# Patient Record
Sex: Female | Born: 1960 | Hispanic: No | Marital: Single | Smoking: Former smoker
Health system: Southern US, Community
[De-identification: ages and names within clinical notes are randomized; demographics above are authoritative.]

## PROBLEM LIST (undated history)

## (undated) ENCOUNTER — Emergency Department (HOSPITAL_COMMUNITY): Disposition: A | Discharge status: Home / Self Care | Payer: Self-pay

## (undated) DIAGNOSIS — K219 Gastro-esophageal reflux disease without esophagitis: Secondary | ICD-10-CM

## (undated) DIAGNOSIS — F419 Anxiety disorder, unspecified: Secondary | ICD-10-CM

## (undated) DIAGNOSIS — K900 Celiac disease: Secondary | ICD-10-CM

## (undated) DIAGNOSIS — F329 Major depressive disorder, single episode, unspecified: Secondary | ICD-10-CM

## (undated) DIAGNOSIS — N289 Disorder of kidney and ureter, unspecified: Secondary | ICD-10-CM

## (undated) DIAGNOSIS — E119 Type 2 diabetes mellitus without complications: Secondary | ICD-10-CM

## (undated) DIAGNOSIS — D649 Anemia, unspecified: Secondary | ICD-10-CM

## (undated) DIAGNOSIS — M1990 Unspecified osteoarthritis, unspecified site: Secondary | ICD-10-CM

## (undated) DIAGNOSIS — D72819 Decreased white blood cell count, unspecified: Secondary | ICD-10-CM

## (undated) DIAGNOSIS — F32A Depression, unspecified: Secondary | ICD-10-CM

## (undated) DIAGNOSIS — K861 Other chronic pancreatitis: Secondary | ICD-10-CM

## (undated) DIAGNOSIS — G4730 Sleep apnea, unspecified: Secondary | ICD-10-CM

## (undated) DIAGNOSIS — J40 Bronchitis, not specified as acute or chronic: Secondary | ICD-10-CM

## (undated) DIAGNOSIS — I10 Essential (primary) hypertension: Secondary | ICD-10-CM

## (undated) DIAGNOSIS — F514 Sleep terrors [night terrors]: Secondary | ICD-10-CM

## (undated) DIAGNOSIS — F4310 Post-traumatic stress disorder, unspecified: Secondary | ICD-10-CM

## (undated) HISTORY — PX: RHINOPLASTY: SUR1284

## (undated) HISTORY — PX: FOOT SURGERY: SHX648

## (undated) HISTORY — PX: BREAST SURGERY: SHX581

## (undated) NOTE — *Deleted (*Deleted)
HEMATOLOGY/ONCOLOGY CONSULTATION NOTE  Date of Service: 03/28/2020  Patient Care Team: [name], PA-C as PCP - General (Physician Assistant)  CHIEF COMPLAINTS/PURPOSE OF CONSULTATION:  Leukopenia  HISTORY OF PRESENTING ILLNESS:  Savannah Clark is a wonderful 85 y.o. female who has been referred to [place] by Dr. Courtney Paris for evaluation and management of leukopenia. Pt is accompanied today by ***. The pt reports that she is doing well overall.   The pt reports ***   Of note prior to the patient's visit today, pt has had *** completed on *** with results revealing ***.   Most recent lab results (12/04/2019) of CBC is as follows: all values are WNL except for WBC at 2.5K, MPV at 10.3, Neutro Rel at 37.4, Creatinine at 1.2. 12/04/2019 Ferritin at 59.90 12/04/2019 Iron at 44, UIBC at 310.00, TIBC at 354.00  On review of systems, pt reports *** and denies *** and any other symptoms.   On PMHx the pt reports ***. On Social Hx the pt reports *** On Family Hx the pt reports ***  A: -Discussed patient's most recent labs from ***, *** -***  MEDICAL HISTORY:  Past Medical History:  Diagnosis Date  . Anemia   . Anxiety   . Asthma   . Bronchitis   . Celiac disease   . Depression   . Night terror disorder   . Pancreatitis, chronic (HCC)   . PTSD (post-traumatic stress disorder)   . Renal disorder    mild kidney disease    SURGICAL HISTORY: Past Surgical History:  Procedure Laterality Date  . FOOT SURGERY      SOCIAL HISTORY: Social History   Socioeconomic History  . Marital status: Single    Spouse name: Not on file  . Number of children: Not on file  . Years of education: Not on file  . Highest education level: Not on file  Occupational History  . Not on file  Tobacco Use  . Smoking status: Former Smoker    Quit date: 06/09/2007    Years since quitting: 12.8  . Smokeless tobacco: Never Used  Substance and Sexual Activity  . Alcohol use: Yes    Comment:  rare  . Drug use: No  . Sexual activity: Not on file  Other Topics Concern  . Not on file  Social History Narrative  . Not on file   Social Determinants of Health   Financial Resource Strain:   . Difficulty of Paying Living Expenses: Not on file  Food Insecurity:   . Worried About Programme researcher, broadcasting/film/video in the Last Year: Not on file  . Ran Out of Food in the Last Year: Not on file  Transportation Needs:   . Lack of Transportation (Medical): Not on file  . Lack of Transportation (Non-Medical): Not on file  Physical Activity:   . Days of Exercise per Week: Not on file  . Minutes of Exercise per Session: Not on file  Stress:   . Feeling of Stress : Not on file  Social Connections:   . Frequency of Communication with Friends and Family: Not on file  . Frequency of Social Gatherings with Friends and Family: Not on file  . Attends Religious Services: Not on file  . Active Member of Clubs or Organizations: Not on file  . Attends Banker Meetings: Not on file  . Marital Status: Not on file  Intimate Partner Violence:   . Fear of Current or Ex-Partner: Not on file  .  Emotionally Abused: Not on file  . Physically Abused: Not on file  . Sexually Abused: Not on file    FAMILY HISTORY: No family history on file.  ALLERGIES:  is allergic to adhesive [tape], amoxicillin-pot clavulanate, aspirin, farxiga [dapagliflozin], gluten meal, januvia [sitagliptin], lactose intolerance (gi), and wheat.  MEDICATIONS:  Current Outpatient Medications  Medication Sig Dispense Refill  . albuterol (PROVENTIL HFA;VENTOLIN HFA) 108 (90 BASE) MCG/ACT inhaler Inhale 2 puffs into the lungs every 6 (six) hours as needed. wheezing     . b complex vitamins tablet Take 1 tablet by mouth daily.   (Patient not taking: Reported on 01/09/2020)    . Biotin 5000 MCG CAPS Take by mouth.    Marland Kitchen buPROPion HCl (WELLBUTRIN PO) Take by mouth.    . busPIRone (BUSPAR) 5 MG tablet Take 15 mg by mouth 2 (two) times  daily.  (Patient not taking: Reported on 01/09/2020)    . CALCIUM PO Take 1 tablet by mouth daily.    . cetirizine (ZYRTEC) 5 MG chewable tablet Chew 2 tablets (10 mg total) by mouth daily. 30 tablet 0  . clindamycin (CLEOCIN) 300 MG capsule Take 1 capsule (300 mg total) by mouth 4 (four) times daily. X 7 days (Patient not taking: Reported on 01/09/2020) 28 capsule 0  . diphenhydrAMINE (BENADRYL) 25 MG tablet Take 25 mg by mouth every 6 (six) hours as needed. (Patient not taking: Reported on 01/09/2020)    . fluticasone (FLONASE) 50 MCG/ACT nasal spray Place 1 spray into both nostrils daily for 5 doses. 9.9 g 0  . Fluticasone-Salmeterol (ADVAIR) 250-50 MCG/DOSE AEPB Inhale 1 puff into the lungs 2 (two) times daily.    . hydrOXYzine (VISTARIL) 50 MG capsule Take 50 mg by mouth 2 (two) times daily.      . magnesium 30 MG tablet Take 30 mg by mouth 2 (two) times daily.    . naproxen (NAPROSYN) 500 MG tablet Take 1 tablet (500 mg total) by mouth 2 (two) times daily. (Patient not taking: Reported on 01/09/2020) 30 tablet 0  . ondansetron (ZOFRAN ODT) 8 MG disintegrating tablet 8mg  ODT q8  hours prn nausea 6 tablet 0  . pantoprazole (PROTONIX) 40 MG tablet Take 40 mg by mouth daily. (Patient not taking: Reported on 01/09/2020)    . prazosin (MINIPRESS) 1 MG capsule Take 1 mg by mouth at bedtime. (Patient not taking: Reported on 01/09/2020)    . Prenatal Vit-Fe Fumarate-FA (PRENATAL MULTIVITAMIN) TABS tablet Take 1 tablet by mouth daily at 12 noon.    . prenatal vitamin w/FE, FA (PRENATAL 1 + 1) 27-1 MG TABS tablet Take 1 tablet by mouth daily at 12 noon. (Patient not taking: Reported on 01/09/2020)    . topiramate (TOPAMAX) 50 MG tablet Take 50 mg by mouth 2 (two) times daily.   (Patient not taking: Reported on 01/09/2020)    . Vortioxetine HBr (BRINTELLIX) 20 MG TABS Take by mouth.     No current facility-administered medications for this visit.    REVIEW OF SYSTEMS:    10 Point review of Systems was done is  negative except as noted above.  PHYSICAL EXAMINATION: ECOG PERFORMANCE STATUS: {CHL ONC ECOG RU:[id number]}  .There were no vitals filed for this visit. There were no vitals filed for this visit. .There is no height or weight on file to calculate BMI.  *** GENERAL:alert, in no acute distress and comfortable SKIN: no acute rashes, no significant lesions EYES: conjunctiva are pink and non-injected, sclera anicteric OROPHARYNX:  MMM, no exudates, no oropharyngeal erythema or ulceration NECK: supple, no JVD LYMPH:  no palpable lymphadenopathy in the cervical, axillary or inguinal regions LUNGS: clear to auscultation b/l with normal respiratory effort HEART: regular rate & rhythm ABDOMEN:  normoactive bowel sounds , non tender, not distended. Extremity: no pedal edema PSYCH: alert & oriented x 3 with fluent speech NEURO: no focal motor/sensory deficits  LABORATORY DATA:  I have reviewed the data as listed  . CBC Latest Ref Rng & Units 04/22/2011 04/22/2011 04/10/2011  WBC 4.0 - 10.5 K/uL - 5.3 -  Hemoglobin 12.0 - 15.0 g/dL 16.1 09.6 04.5  Hematocrit 36 - 46 % 38.0 35.6(L) 44.0  Platelets 150 - 400 K/uL - 212 -    . CMP Latest Ref Rng & Units 04/22/2011 04/10/2011  Glucose 70 - 99 mg/dL 86 409(W)  BUN 6 - 23 mg/dL 16 14  Creatinine 1.19 - 1.10 mg/dL 1.47 8.29  Sodium 562 - 145 mEq/L 141 139  Potassium 3.5 - 5.1 mEq/L 3.8 4.2  Chloride 96 - 112 mEq/L 107 104     RADIOGRAPHIC STUDIES: I have personally reviewed the radiological images as listed and agreed with the findings in the report. No results found.  ASSESSMENT & PLAN:   PLAN: ***  FOLLOW UP: ***  All of the patients questions were answered with apparent satisfaction. The patient knows to call the clinic with any problems, questions or concerns.  I spent *** counseling the patient face to face. The total time spent in the appointment was *** and more than 50% was on counseling and direct patient cares.     Wyvonnia Lora MD MS AAHIVMS [facility] Hematology/Oncology Physician [facility]  (Office):       [phone] (Work cell):  [phone] (Fax):           [phone]  03/28/2020 8:10 AM  I, Carollee Herter, am acting as a scribe for Dr. Wyvonnia Lora.   {Add Production assistant, radio Statement}

---
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
## 2011-02-27 ENCOUNTER — Inpatient Hospital Stay (INDEPENDENT_AMBULATORY_CARE_PROVIDER_SITE_OTHER)
Admission: RE | Admit: 2011-02-27 | Discharge: 2011-02-27 | Disposition: A | Discharge status: Home / Self Care | Payer: Self-pay | Source: Ambulatory Visit | Attending: Family Medicine | Admitting: Family Medicine

## 2011-02-27 DIAGNOSIS — W57XXXA Bitten or stung by nonvenomous insect and other nonvenomous arthropods, initial encounter: Secondary | ICD-10-CM

## 2011-02-27 DIAGNOSIS — Z760 Encounter for issue of repeat prescription: Secondary | ICD-10-CM

## 2011-04-10 ENCOUNTER — Inpatient Hospital Stay (INDEPENDENT_AMBULATORY_CARE_PROVIDER_SITE_OTHER)
Admission: RE | Admit: 2011-04-10 | Discharge: 2011-04-10 | Disposition: A | Discharge status: Home / Self Care | Payer: Self-pay | Source: Ambulatory Visit | Attending: Family Medicine | Admitting: Family Medicine

## 2011-04-10 DIAGNOSIS — R5383 Other fatigue: Secondary | ICD-10-CM

## 2011-04-10 DIAGNOSIS — F329 Major depressive disorder, single episode, unspecified: Secondary | ICD-10-CM

## 2011-04-10 DIAGNOSIS — J309 Allergic rhinitis, unspecified: Secondary | ICD-10-CM

## 2011-04-10 LAB — POCT I-STAT, CHEM 8
Calcium, Ion: 1.23 mmol/L (ref 1.12–1.32)
Glucose, Bld: 106 mg/dL — ABNORMAL HIGH (ref 70–99)
HCT: 44 % (ref 36.0–46.0)
Hemoglobin: 15 g/dL (ref 12.0–15.0)
TCO2: 25 mmol/L (ref 0–100)

## 2011-04-14 NOTE — ED Notes (Signed)
Spoke to pharmacist at [facility] about medication change.... Pharmacy called requesting hydroxyzine be changed to hydroxyzine pamoate 50mg   One bid, #60.  Order received from dr Lorenz Coaster.  klh

## 2011-04-22 ENCOUNTER — Emergency Department (HOSPITAL_COMMUNITY): Payer: Self-pay

## 2011-04-22 ENCOUNTER — Emergency Department (HOSPITAL_COMMUNITY)
Admission: EM | Admit: 2011-04-22 | Discharge: 2011-04-22 | Disposition: A | Discharge status: Home / Self Care | Payer: Self-pay | Attending: Emergency Medicine | Admitting: Emergency Medicine

## 2011-04-22 ENCOUNTER — Encounter: Payer: Self-pay | Admitting: *Deleted

## 2011-04-22 DIAGNOSIS — R0602 Shortness of breath: Secondary | ICD-10-CM | POA: Insufficient documentation

## 2011-04-22 DIAGNOSIS — R079 Chest pain, unspecified: Secondary | ICD-10-CM | POA: Insufficient documentation

## 2011-04-22 DIAGNOSIS — B9789 Other viral agents as the cause of diseases classified elsewhere: Secondary | ICD-10-CM | POA: Insufficient documentation

## 2011-04-22 DIAGNOSIS — R059 Cough, unspecified: Secondary | ICD-10-CM | POA: Insufficient documentation

## 2011-04-22 DIAGNOSIS — IMO0001 Reserved for inherently not codable concepts without codable children: Secondary | ICD-10-CM | POA: Insufficient documentation

## 2011-04-22 DIAGNOSIS — R599 Enlarged lymph nodes, unspecified: Secondary | ICD-10-CM | POA: Insufficient documentation

## 2011-04-22 DIAGNOSIS — B349 Viral infection, unspecified: Secondary | ICD-10-CM

## 2011-04-22 DIAGNOSIS — R5381 Other malaise: Secondary | ICD-10-CM | POA: Insufficient documentation

## 2011-04-22 DIAGNOSIS — R05 Cough: Secondary | ICD-10-CM | POA: Insufficient documentation

## 2011-04-22 HISTORY — DX: Celiac disease: K90.0

## 2011-04-22 LAB — CBC
MCHC: 34.3 g/dL (ref 30.0–36.0)
Platelets: 212 10*3/uL (ref 150–400)
RDW: 13.2 % (ref 11.5–15.5)
WBC: 5.3 10*3/uL (ref 4.0–10.5)

## 2011-04-22 LAB — POCT I-STAT, CHEM 8
Glucose, Bld: 86 mg/dL (ref 70–99)
HCT: 38 % (ref 36.0–46.0)
Hemoglobin: 12.9 g/dL (ref 12.0–15.0)
Potassium: 3.8 mEq/L (ref 3.5–5.1)
Sodium: 141 mEq/L (ref 135–145)

## 2011-04-22 LAB — DIFFERENTIAL
Basophils Absolute: 0 10*3/uL (ref 0.0–0.1)
Basophils Relative: 1 % (ref 0–1)
Monocytes Absolute: 0.4 10*3/uL (ref 0.1–1.0)
Neutro Abs: 1.9 10*3/uL (ref 1.7–7.7)
Neutrophils Relative %: 36 % — ABNORMAL LOW (ref 43–77)

## 2011-04-22 LAB — RAPID STREP SCREEN (MED CTR MEBANE ONLY): Streptococcus, Group A Screen (Direct): NEGATIVE

## 2011-04-22 MED ORDER — HYDROCODONE-ACETAMINOPHEN 5-325 MG PO TABS: 1.0000 | ORAL_TABLET | ORAL | Status: AC | PRN

## 2011-04-22 MED ORDER — MORPHINE SULFATE 4 MG/ML IJ SOLN
4.0000 mg | Freq: Once | INTRAMUSCULAR | Status: AC
  Administered 2011-04-22: 4 mg via INTRAVENOUS
  Filled 2011-04-22: qty 1

## 2011-04-22 MED ORDER — SODIUM CHLORIDE 0.9 % IV BOLUS (SEPSIS)
1000.0000 mL | Freq: Once | INTRAVENOUS | Status: AC
  Administered 2011-04-22: 1000 mL via INTRAVENOUS

## 2011-04-22 MED ORDER — ONDANSETRON HCL 4 MG/2ML IJ SOLN
4.0000 mg | Freq: Once | INTRAMUSCULAR | Status: AC
  Administered 2011-04-22: 4 mg via INTRAVENOUS
  Filled 2011-04-22: qty 2

## 2011-04-22 NOTE — ED Provider Notes (Signed)
History     CSN: [id number] Arrival date & time: 04/22/2011 11:25 AM   First MD Initiated Contact with Patient 04/22/11 1541      Chief Complaint  Patient presents with  . Cough  . Influenza    (Consider location/radiation/quality/duration/timing/severity/associated sxs/prior treatment) Patient is a 50 y.o. female presenting with cough and flu symptoms. The history is provided by the patient.  Cough The current episode started more than 1 week ago. The problem occurs constantly. The cough is non-productive. Associated symptoms include chest pain, chills, sore throat, myalgias and shortness of breath. Treatments tried: doxycycline and zofran. The treatment provided no relief.  Influenza Associated symptoms include chest pain and shortness of breath.   patient states she's had the flu for the last week and a half. States she's had nausea vomiting and a little diarrhea. She's had a nonproductive cough. She states that she saw a doctor and was started on doxycycline and Zofran without relief. She states she is so short of breath now she cannot walk the 6 minute walk to the bus stop. She states that she hurts all over, especially with coughing. No sick contacts. She states that she is dehydrated. He is having chest or abdominal pain.  Past Medical History  Diagnosis Date  . Asthma   . Celiac disease     Past Surgical History  Procedure Date  . Foot surgery     No family history on file.  History  Substance Use Topics  . Smoking status: Never Smoker   . Smokeless tobacco: Not on file  . Alcohol Use: Yes     socially    OB History    Grav Para Term Preterm Abortions TAB SAB Ect Mult Living                  Review of Systems  Constitutional: Positive for chills and fatigue. Negative for appetite change.  HENT: Positive for sore throat. Negative for neck pain.   Respiratory: Positive for cough and shortness of breath.   Cardiovascular: Positive for chest pain.    Genitourinary: Negative for dysuria.  Musculoskeletal: Positive for myalgias.  Neurological: Positive for weakness.  Hematological: Negative for adenopathy.  Psychiatric/Behavioral: Negative for behavioral problems.    Allergies  Adhesive; Aspirin; Augmentin; and Wheat  Home Medications   Current Outpatient Rx  Name Route Sig Dispense Refill  . ALBUTEROL SULFATE HFA 108 (90 BASE) MCG/ACT IN AERS Inhalation Inhale 2 puffs into the lungs every 6 (six) hours as needed. wheezing     . B COMPLEX PO TABS Oral Take 1 tablet by mouth daily.      . BUSPIRONE HCL 5 MG PO TABS Oral Take 5 mg by mouth 2 (two) times daily.      Marland Kitchen CITALOPRAM HYDROBROMIDE 40 MG PO TABS Oral Take 40 mg by mouth daily.      Marland Kitchen DOXYCYCLINE HYCLATE 100 MG PO CAPS Oral Take 100 mg by mouth 2 (two) times daily.      Marland Kitchen FLUTICASONE PROPIONATE 50 MCG/ACT NA SUSP Nasal Place 2 sprays into the nose daily as needed. Allergies      . HYDROXYZINE PAMOATE 50 MG PO CAPS Oral Take 50 mg by mouth 2 (two) times daily.      Marland Kitchen PRENATAL PLUS 27-1 MG PO TABS Oral Take 1 tablet by mouth daily.      . TOPIRAMATE 50 MG PO TABS Oral Take 50 mg by mouth 2 (two) times daily.      Marland Kitchen  TRAZODONE HCL 100 MG PO TABS Oral Take 100 mg by mouth at bedtime.      Marland Kitchen HYDROCODONE-ACETAMINOPHEN 5-325 MG PO TABS Oral Take 1 tablet by mouth every 4 (four) hours as needed for pain. 5 tablet 0    BP 141/84  Pulse 57  Temp(Src) 98 F (36.7 C) (Oral)  Resp 20  SpO2 100%  Physical Exam  Nursing note and vitals reviewed. Constitutional: She is oriented to person, place, and time. She appears well-developed and well-nourished.  HENT:  Head: Normocephalic and atraumatic.  Mouth/Throat: No oropharyngeal exudate.       Mild posterior pharyngeal erythema without exudate. Mild anterior cervical lymph nodes.  Eyes: EOM are normal. Pupils are equal, round, and reactive to light.  Neck: Normal range of motion. Neck supple.  Cardiovascular: Normal rate, regular  rhythm and normal heart sounds.   No murmur heard. Pulmonary/Chest: Effort normal and breath sounds normal. No respiratory distress. She has no wheezes. She has no rales.  Abdominal: Soft. Bowel sounds are normal. She exhibits no distension. There is no tenderness. There is no rebound and no guarding.  Musculoskeletal: Normal range of motion.  Neurological: She is alert and oriented to person, place, and time. No cranial nerve deficit.  Skin: Skin is warm and dry.  Psychiatric: She has a normal mood and affect. Her speech is normal.    ED Course  Procedures (including critical care time)  Labs Reviewed  CBC - Abnormal; Notable for the following:    HCT 35.6 (*)    All other components within normal limits  DIFFERENTIAL - Abnormal; Notable for the following:    Neutrophils Relative 36 (*)    Lymphocytes Relative 52 (*)    All other components within normal limits  RAPID STREP SCREEN  POCT I-STAT, CHEM 8  I-STAT, CHEM 8   Dg Chest 2 View  04/22/2011  *RADIOLOGY REPORT*  Clinical Data: S O B  CHEST - 2 VIEW  Comparison: None  Findings: The heart size and mediastinal contours are within normal limits.  Both lungs are clear.  The visualized skeletal structures are unremarkable.  IMPRESSION: No active cardiopulmonary abnormalities.  Original Report Authenticated By: Rosealee Albee, M.D.     1. Viral infection       MDM  Patient has had cough nausea vomiting and some diarrhea for the last week. She states she shortness of breath persistent the bus stop. Her chest x-ray does not show pneumonia. She feels better after IV fluids. She'll be discharged home with hydrocodone for cough and pains.        Juliet Rude. Rubin Payor, MD 04/22/11 Rickey Primus

## 2011-04-22 NOTE — ED Notes (Signed)
Pt reports nonproductive cough, flu-like symptoms, n/v x1week. Shob episode walking to bus stop this am. No distress. ems bp 130/84, p 84, r 12.

## 2011-09-17 ENCOUNTER — Encounter (HOSPITAL_COMMUNITY): Payer: Self-pay | Admitting: Emergency Medicine

## 2011-09-17 ENCOUNTER — Emergency Department (HOSPITAL_COMMUNITY)
Admission: EM | Admit: 2011-09-17 | Discharge: 2011-09-17 | Disposition: A | Discharge status: Home / Self Care | Payer: Self-pay | Attending: Emergency Medicine | Admitting: Emergency Medicine

## 2011-09-17 ENCOUNTER — Emergency Department (HOSPITAL_COMMUNITY): Payer: Self-pay

## 2011-09-17 DIAGNOSIS — J45909 Unspecified asthma, uncomplicated: Secondary | ICD-10-CM | POA: Insufficient documentation

## 2011-09-17 DIAGNOSIS — R059 Cough, unspecified: Secondary | ICD-10-CM | POA: Insufficient documentation

## 2011-09-17 DIAGNOSIS — R0789 Other chest pain: Secondary | ICD-10-CM | POA: Insufficient documentation

## 2011-09-17 DIAGNOSIS — R05 Cough: Secondary | ICD-10-CM | POA: Insufficient documentation

## 2011-09-17 DIAGNOSIS — R0602 Shortness of breath: Secondary | ICD-10-CM | POA: Insufficient documentation

## 2011-09-17 HISTORY — DX: Anemia, unspecified: D64.9

## 2011-09-17 HISTORY — DX: Disorder of kidney and ureter, unspecified: N28.9

## 2011-09-17 MED ORDER — PREDNISONE 10 MG PO TABS: 20.0000 mg | ORAL_TABLET | Freq: Two times a day (BID) | ORAL | Status: DC

## 2011-09-17 MED ORDER — PREDNISONE 20 MG PO TABS
60.0000 mg | ORAL_TABLET | Freq: Once | ORAL | Status: AC
  Administered 2011-09-17: 60 mg via ORAL
  Filled 2011-09-17: qty 3

## 2011-09-17 MED ORDER — IPRATROPIUM BROMIDE 0.02 % IN SOLN
0.5000 mg | Freq: Once | RESPIRATORY_TRACT | Status: AC
  Administered 2011-09-17: 0.5 mg via RESPIRATORY_TRACT
  Filled 2011-09-17: qty 2.5

## 2011-09-17 MED ORDER — CETIRIZINE HCL 5 MG PO CHEW: 10.0000 mg | CHEWABLE_TABLET | Freq: Every day | ORAL | Status: DC

## 2011-09-17 MED ORDER — ALBUTEROL SULFATE (5 MG/ML) 0.5% IN NEBU
5.0000 mg | INHALATION_SOLUTION | Freq: Once | RESPIRATORY_TRACT | Status: AC
  Administered 2011-09-17: 5 mg via RESPIRATORY_TRACT
  Filled 2011-09-17: qty 1

## 2011-09-17 NOTE — ED Notes (Signed)
Patient transported to X-ray 

## 2011-09-17 NOTE — Discharge Instructions (Signed)
Take prednisone and cetirizine as prescribed.  Try taking sudafed twice a day as needed for nasal congestion.  Continue your ventolin (rescue) and symbicort (maintenance) as prescribed.  You should return to the ER if your symptoms worsen, particularly if you develop increasing difficulty breathing.

## 2011-09-17 NOTE — ED Notes (Signed)
Pt reports over the last 3 weeks she has had difficulty breathing, runny nose and dry cough due to her seasonal allergies. Pt reports using home controller medications including her albuterol inhaler with little relief. Lungs CTA in triage, no resp distress noted.

## 2011-09-17 NOTE — ED Provider Notes (Signed)
History     CSN: [id number]  Arrival date & time 09/17/11  1205   First MD Initiated Contact with Patient 09/17/11 1241      Chief Complaint  Patient presents with  . Allergies    (Consider location/radiation/quality/duration/timing/severity/associated sxs/prior treatment) HPI History provided by pt.   Pt has h/o seasonal allergies and allergic asthma.  For the past three weeks, she has been experiencing gradually worsening non-productive cough, diffuse chest tightness, SOB w/ minimal exertion and non-productive cough.  Has been taking symbicort and albuterol but just realized today that she has been taking them incorrectly for years.  Uses symbicort for rescue and albuterol for maintenance.  Has not had relief w/ these medications.  Has also had subjective fever, nasal congestion and rhinorrhea.  Has tried claritin which makes her intolerably drowsy and has not had any improvement of symptoms w/ allegra.    Past Medical History  Diagnosis Date  . Asthma   . Celiac disease   . Renal disorder   . Anemia     Past Surgical History  Procedure Date  . Foot surgery     History reviewed. No pertinent family history.  History  Substance Use Topics  . Smoking status: Former Smoker    Quit date: 06/09/2007  . Smokeless tobacco: Not on file  . Alcohol Use: Yes     socially    OB History    Grav Para Term Preterm Abortions TAB SAB Ect Mult Living                  Review of Systems  All other systems reviewed and are negative.    Allergies  Adhesive; Aspirin; Augmentin; and Wheat  Home Medications   Current Outpatient Rx  Name Route Sig Dispense Refill  . ALBUTEROL SULFATE HFA 108 (90 BASE) MCG/ACT IN AERS Inhalation Inhale 2 puffs into the lungs every 6 (six) hours as needed. wheezing     . B COMPLEX PO TABS Oral Take 1 tablet by mouth daily.      . BUDESONIDE-FORMOTEROL FUMARATE 160-4.5 MCG/ACT IN AERO Inhalation Inhale 2 puffs into the lungs 2 (two) times daily.      . BUSPIRONE HCL 5 MG PO TABS Oral Take 5 mg by mouth 2 (two) times daily.      Marland Kitchen CALCIUM PO Oral Take 1 tablet by mouth daily.    Marland Kitchen CITALOPRAM HYDROBROMIDE 40 MG PO TABS Oral Take 40 mg by mouth daily.      Marland Kitchen HYDROXYZINE PAMOATE 50 MG PO CAPS Oral Take 50 mg by mouth 2 (two) times daily.      . MOMETASONE FUROATE 50 MCG/ACT NA SUSP Nasal Place 2 sprays into the nose daily.    Marland Kitchen MONTELUKAST SODIUM 10 MG PO TABS Oral Take 10 mg by mouth at bedtime.    Marland Kitchen TEMAZEPAM 15 MG PO CAPS Oral Take 15 mg by mouth at bedtime as needed. For sleep    . TOPIRAMATE 50 MG PO TABS Oral Take 50 mg by mouth 2 (two) times daily.        BP 124/83  Pulse 75  Temp(Src) 98.4 F (36.9 C) (Oral)  Resp 18  SpO2 99%  Physical Exam  Nursing note and vitals reviewed. Constitutional: She is oriented to person, place, and time. She appears well-developed and well-nourished. No distress.  HENT:  Head: Normocephalic and atraumatic. No trismus in the jaw.  Right Ear: Tympanic membrane, external ear and ear canal normal.  Left Ear:  Tympanic membrane, external ear and ear canal normal.  Mouth/Throat: Uvula is midline and mucous membranes are normal. No oropharyngeal exudate, posterior oropharyngeal edema or posterior oropharyngeal erythema.  Eyes:       Normal appearance  Neck: Normal range of motion. Neck supple.  Cardiovascular: Normal rate and regular rhythm.   Pulmonary/Chest: Effort normal and breath sounds normal. She has no wheezes. She exhibits no tenderness.  Musculoskeletal: Normal range of motion.  Lymphadenopathy:    She has no cervical adenopathy.  Neurological: She is alert and oriented to person, place, and time.  Skin: Skin is warm and dry. No rash noted.  Psychiatric: She has a normal mood and affect. Her behavior is normal.    ED Course  Procedures (including critical care time)   Date: 09/17/2011  Rate: 67  Rhythm: normal sinus rhythm and sinus arrhythmia  QRS Axis: normal  Intervals:  normal  ST/T Wave abnormalities: normal  Conduction Disutrbances:none  Narrative Interpretation:   Old EKG Reviewed: none available   Labs Reviewed - No data to display Dg Chest 2 View  09/17/2011  *RADIOLOGY REPORT*  Clinical Data: Shortness of breath, cough.  CHEST - 2 VIEW  Comparison: 04/22/2011  Findings: Heart and mediastinal contours are within normal limits. No focal opacities or effusions.  No acute bony abnormality.  IMPRESSION: No active cardiopulmonary disease.  Original Report Authenticated By: Cyndie Chime, M.D.     1. Allergic rhinitis   2. Allergic asthma       MDM  Pt w/ h/o allergic rhinitis/asthma presents w/ nasal congestion, sneezing, cough/chest tightness/wheezing/SOB and subjective fever.  Sx typical.  Has been taking her inhaled steroid and rescue inhaler inappropriately.  On exam, afebrile, no respiratory distress, lungs clear, no coughing.  EKG non-ischemic and CXR neg for pneumonia.  Pt received a breathing treatment with some relief of sx. D/c'd home w/ prescriptions for prednisone and zyrtec (no relief w/ allegra SE of claritin intolerable), reminder of proper use of symbicort and ventolin, and recommendation to take sudafed.  Return precautions discussed.       Otilio Miu, [place] 09/17/11 2126

## 2011-09-19 NOTE — ED Provider Notes (Signed)
Medical screening examination/treatment/procedure(s) were performed by non-physician practitioner and as supervising physician I was immediately available for consultation/collaboration.    Basil Buffin L Carrel Leather, MD 09/19/11 1136 

## 2014-01-02 ENCOUNTER — Emergency Department (HOSPITAL_BASED_OUTPATIENT_CLINIC_OR_DEPARTMENT_OTHER): Payer: Self-pay

## 2014-01-02 ENCOUNTER — Emergency Department (HOSPITAL_BASED_OUTPATIENT_CLINIC_OR_DEPARTMENT_OTHER)
Admission: EM | Admit: 2014-01-02 | Discharge: 2014-01-02 | Disposition: A | Discharge status: Home / Self Care | Payer: Self-pay | Attending: Emergency Medicine | Admitting: Emergency Medicine

## 2014-01-02 ENCOUNTER — Encounter (HOSPITAL_BASED_OUTPATIENT_CLINIC_OR_DEPARTMENT_OTHER): Payer: Self-pay | Admitting: Emergency Medicine

## 2014-01-02 DIAGNOSIS — R509 Fever, unspecified: Secondary | ICD-10-CM | POA: Insufficient documentation

## 2014-01-02 DIAGNOSIS — Z87448 Personal history of other diseases of urinary system: Secondary | ICD-10-CM | POA: Insufficient documentation

## 2014-01-02 DIAGNOSIS — Z862 Personal history of diseases of the blood and blood-forming organs and certain disorders involving the immune mechanism: Secondary | ICD-10-CM | POA: Insufficient documentation

## 2014-01-02 DIAGNOSIS — Z79899 Other long term (current) drug therapy: Secondary | ICD-10-CM | POA: Insufficient documentation

## 2014-01-02 DIAGNOSIS — Z87891 Personal history of nicotine dependence: Secondary | ICD-10-CM | POA: Insufficient documentation

## 2014-01-02 DIAGNOSIS — F411 Generalized anxiety disorder: Secondary | ICD-10-CM | POA: Insufficient documentation

## 2014-01-02 DIAGNOSIS — Z8719 Personal history of other diseases of the digestive system: Secondary | ICD-10-CM | POA: Insufficient documentation

## 2014-01-02 DIAGNOSIS — Z880 Allergy status to penicillin: Secondary | ICD-10-CM | POA: Insufficient documentation

## 2014-01-02 DIAGNOSIS — F3289 Other specified depressive episodes: Secondary | ICD-10-CM | POA: Insufficient documentation

## 2014-01-02 DIAGNOSIS — F329 Major depressive disorder, single episode, unspecified: Secondary | ICD-10-CM | POA: Insufficient documentation

## 2014-01-02 DIAGNOSIS — J40 Bronchitis, not specified as acute or chronic: Secondary | ICD-10-CM

## 2014-01-02 DIAGNOSIS — IMO0002 Reserved for concepts with insufficient information to code with codable children: Secondary | ICD-10-CM | POA: Insufficient documentation

## 2014-01-02 DIAGNOSIS — J45909 Unspecified asthma, uncomplicated: Secondary | ICD-10-CM | POA: Insufficient documentation

## 2014-01-02 DIAGNOSIS — Z3202 Encounter for pregnancy test, result negative: Secondary | ICD-10-CM | POA: Insufficient documentation

## 2014-01-02 HISTORY — DX: Sleep terrors (night terrors): F51.4

## 2014-01-02 HISTORY — DX: Post-traumatic stress disorder, unspecified: F43.10

## 2014-01-02 HISTORY — DX: Other chronic pancreatitis: K86.1

## 2014-01-02 HISTORY — DX: Depression, unspecified: F32.A

## 2014-01-02 HISTORY — DX: Major depressive disorder, single episode, unspecified: F32.9

## 2014-01-02 HISTORY — DX: Anxiety disorder, unspecified: F41.9

## 2014-01-02 LAB — URINALYSIS, ROUTINE W REFLEX MICROSCOPIC
BILIRUBIN URINE: NEGATIVE
Glucose, UA: NEGATIVE mg/dL
HGB URINE DIPSTICK: NEGATIVE
Ketones, ur: NEGATIVE mg/dL
Leukocytes, UA: NEGATIVE
Nitrite: NEGATIVE
PROTEIN: NEGATIVE mg/dL
Specific Gravity, Urine: 1.022 (ref 1.005–1.030)
Urobilinogen, UA: 0.2 mg/dL (ref 0.0–1.0)
pH: 6.5 (ref 5.0–8.0)

## 2014-01-02 LAB — CBG MONITORING, ED: Glucose-Capillary: 98 mg/dL (ref 70–99)

## 2014-01-02 LAB — PREGNANCY, URINE: Preg Test, Ur: NEGATIVE

## 2014-01-02 MED ORDER — IPRATROPIUM BROMIDE 0.02 % IN SOLN
0.5000 mg | Freq: Once | RESPIRATORY_TRACT | Status: AC
  Administered 2014-01-02: 0.5 mg via RESPIRATORY_TRACT
  Filled 2014-01-02: qty 2.5

## 2014-01-02 MED ORDER — PREDNISONE 50 MG PO TABS: 50.0000 mg | ORAL_TABLET | Freq: Every day | ORAL | Status: DC

## 2014-01-02 MED ORDER — ALBUTEROL SULFATE (2.5 MG/3ML) 0.083% IN NEBU
5.0000 mg | INHALATION_SOLUTION | Freq: Once | RESPIRATORY_TRACT | Status: AC
  Administered 2014-01-02: 5 mg via RESPIRATORY_TRACT
  Filled 2014-01-02: qty 6

## 2014-01-02 MED ORDER — PREDNISONE 50 MG PO TABS
60.0000 mg | ORAL_TABLET | Freq: Once | ORAL | Status: AC
  Administered 2014-01-02: 60 mg via ORAL
  Filled 2014-01-02 (×2): qty 1

## 2014-01-02 NOTE — ED Provider Notes (Signed)
CSN: [id number]     Arrival date & time 01/02/14  1320 History   First MD Initiated Contact with Patient 01/02/14 1358     Chief Complaint  Patient presents with  . Fever     (Consider location/radiation/quality/duration/timing/severity/associated sxs/prior Treatment) HPI Pt with hx of asthma presents with c/o cold symptoms, cough and nasal congestion with subjective fever.  Symptoms began 3 days ago but she feels they are worse today. No difficulty breathing.  No chest pain other than with coughing.  She states she has tried using her inhalers but they did not help her symptoms.  No vomiting or diarrhea.  No sore throat.   Immunizations are up to date.  No recent travel.  No sick contacts.  There are no other associated systemic symptoms, there are no other alleviating or modifying factors.   Past Medical History  Diagnosis Date  . Asthma   . Celiac disease   . Anemia   . Renal disorder     mild kidney disease  . Pancreatitis, chronic   . PTSD (post-traumatic stress disorder)   . Night terror disorder   . Depression   . Anxiety    Past Surgical History  Procedure Laterality Date  . Foot surgery     No family history on file. History  Substance Use Topics  . Smoking status: Former Smoker    Quit date: 06/09/2007  . Smokeless tobacco: Not on file  . Alcohol Use: Yes     Comment: socially   OB History   Grav Para Term Preterm Abortions TAB SAB Ect Mult Living                 Review of Systems ROS reviewed and all otherwise negative except for mentioned in HPI    Allergies  Adhesive; Amoxicillin-pot clavulanate; Aspirin; Gluten meal; and Wheat  Home Medications   Prior to Admission medications   Medication Sig Start Date End Date Taking? Authorizing Provider  albuterol (PROVENTIL HFA;VENTOLIN HFA) 108 (90 BASE) MCG/ACT inhaler Inhale 2 puffs into the lungs every 6 (six) hours as needed. wheezing    Yes Historical Provider, MD  busPIRone (BUSPAR) 5 MG tablet Take  5 mg by mouth 2 (two) times daily.     Yes Historical Provider, MD  CALCIUM PO Take 1 tablet by mouth daily.   Yes Historical Provider, MD  fluticasone (FLONASE) 50 MCG/ACT nasal spray Place 2 sprays into both nostrils daily.   Yes Historical Provider, MD  hydrOXYzine (VISTARIL) 50 MG capsule Take 50 mg by mouth 2 (two) times daily.     Yes Historical Provider, MD  magnesium 30 MG tablet Take 30 mg by mouth 2 (two) times daily.   Yes Historical Provider, MD  montelukast (SINGULAIR) 10 MG tablet Take 10 mg by mouth at bedtime.   Yes Historical Provider, MD  prazosin (MINIPRESS) 1 MG capsule Take 1 mg by mouth at bedtime.   Yes Historical Provider, MD  prenatal vitamin w/FE, FA (PRENATAL 1 + 1) 27-1 MG TABS tablet Take 1 tablet by mouth daily at 12 noon.   Yes Historical Provider, MD  Vortioxetine HBr (BRINTELLIX) 20 MG TABS Take by mouth.   Yes Historical Provider, MD  b complex vitamins tablet Take 1 tablet by mouth daily.      Historical Provider, MD  budesonide-formoterol (SYMBICORT) 160-4.5 MCG/ACT inhaler Inhale 2 puffs into the lungs 2 (two) times daily.    Historical Provider, MD  cetirizine (ZYRTEC) 5 MG chewable tablet Chew  2 tablets (10 mg total) by mouth daily. 09/17/11 09/16/12  Arie Sabina Schinlever, PA-C  citalopram (CELEXA) 40 MG tablet Take 40 mg by mouth daily.      Historical Provider, MD  mometasone (NASONEX) 50 MCG/ACT nasal spray Place 2 sprays into the nose daily.    Historical Provider, MD  predniSONE (DELTASONE) 10 MG tablet Take 2 tablets (20 mg total) by mouth 2 (two) times daily. 09/17/11   Arie Sabina Schinlever, PA-C  predniSONE (DELTASONE) 50 MG tablet Take 1 tablet (50 mg total) by mouth daily. 01/02/14   Ethelda Chick, MD  temazepam (RESTORIL) 15 MG capsule Take 15 mg by mouth at bedtime as needed. For sleep    Historical Provider, MD  topiramate (TOPAMAX) 50 MG tablet Take 50 mg by mouth 2 (two) times daily.      Historical Provider, MD   BP 140/90  Pulse 69   Temp(Src) 98.3 F (36.8 C) (Oral)  Resp 16  Ht 5\' 7"  (1.702 m)  Wt 180 lb (81.647 kg)  BMI 28.19 kg/m2  SpO2 100% Vitals reviewed Physical Exam Physical Examination: General appearance - alert, well appearing, and in no distress Mental status - alert, oriented to person, place, and time Eyes - no conjunctival injection, no scleral icterus Mouth - mucous membranes moist, pharynx normal without lesions Neck - supple, no significant adenopathy Chest - clear to auscultation, no wheezes, rales or rhonchi, symmetric air entry, no wheezing, no increased respiratory effort, somewhat diminished breath sounds Heart - normal rate, regular rhythm, normal S1, S2, no murmurs, rubs, clicks or gallops Extremities - peripheral pulses normal, no pedal edema, no clubbing or cyanosis Skin - normal coloration and turgor, no rashes  ED Course  Procedures (including critical care time) Labs Review Labs Reviewed  URINALYSIS, ROUTINE W REFLEX MICROSCOPIC - Abnormal; Notable for the following:    APPearance CLOUDY (*)    All other components within normal limits  PREGNANCY, URINE  CBG MONITORING, ED    Imaging Review No results found.   EKG Interpretation None      MDM   Final diagnoses:  Bronchitis    Pt presenting with c/o cough, nasal congestion, subjective fever.  cxr reassuring. Pt given breathing treatment in the ED to help with cough and tightness in her breathing.  She was also started on steroids.  No pneumonia or other acute findings on cxr.   Patient is overall nontoxic and well hydrated in appearance.  Discharged with strict return precautions.  Pt agreeable with plan.    , MD 01/04/14 [phone]

## 2014-01-02 NOTE — ED Notes (Signed)
Patient states she has had chills and sweats, with a productive cough with yellow secretions for the last three days.  C/O headache, sinus pain and sore throat.

## 2014-01-02 NOTE — Discharge Instructions (Signed)
Return to the ED with any concerns including chest pain, vomiting and not able to keep down liquids, leg swelling, fainting, decreased level of alertness/lethargy, or any other alarming symptoms

## 2014-10-28 ENCOUNTER — Emergency Department (HOSPITAL_BASED_OUTPATIENT_CLINIC_OR_DEPARTMENT_OTHER)
Admission: EM | Admit: 2014-10-28 | Discharge: 2014-10-28 | Disposition: A | Discharge status: Home / Self Care | Payer: Self-pay | Attending: Emergency Medicine | Admitting: Emergency Medicine

## 2014-10-28 ENCOUNTER — Encounter (HOSPITAL_BASED_OUTPATIENT_CLINIC_OR_DEPARTMENT_OTHER): Payer: Self-pay | Admitting: *Deleted

## 2014-10-28 ENCOUNTER — Emergency Department (HOSPITAL_BASED_OUTPATIENT_CLINIC_OR_DEPARTMENT_OTHER): Payer: Self-pay

## 2014-10-28 DIAGNOSIS — Y92008 Other place in unspecified non-institutional (private) residence as the place of occurrence of the external cause: Secondary | ICD-10-CM | POA: Insufficient documentation

## 2014-10-28 DIAGNOSIS — J45909 Unspecified asthma, uncomplicated: Secondary | ICD-10-CM | POA: Insufficient documentation

## 2014-10-28 DIAGNOSIS — Z79899 Other long term (current) drug therapy: Secondary | ICD-10-CM | POA: Insufficient documentation

## 2014-10-28 DIAGNOSIS — Z7951 Long term (current) use of inhaled steroids: Secondary | ICD-10-CM | POA: Insufficient documentation

## 2014-10-28 DIAGNOSIS — Z87891 Personal history of nicotine dependence: Secondary | ICD-10-CM | POA: Insufficient documentation

## 2014-10-28 DIAGNOSIS — Z880 Allergy status to penicillin: Secondary | ICD-10-CM | POA: Insufficient documentation

## 2014-10-28 DIAGNOSIS — Z8719 Personal history of other diseases of the digestive system: Secondary | ICD-10-CM | POA: Insufficient documentation

## 2014-10-28 DIAGNOSIS — Z9889 Other specified postprocedural states: Secondary | ICD-10-CM | POA: Insufficient documentation

## 2014-10-28 DIAGNOSIS — D649 Anemia, unspecified: Secondary | ICD-10-CM | POA: Insufficient documentation

## 2014-10-28 DIAGNOSIS — W1841XA Slipping, tripping and stumbling without falling due to stepping on object, initial encounter: Secondary | ICD-10-CM | POA: Insufficient documentation

## 2014-10-28 DIAGNOSIS — F329 Major depressive disorder, single episode, unspecified: Secondary | ICD-10-CM | POA: Insufficient documentation

## 2014-10-28 DIAGNOSIS — Y9389 Activity, other specified: Secondary | ICD-10-CM | POA: Insufficient documentation

## 2014-10-28 DIAGNOSIS — F419 Anxiety disorder, unspecified: Secondary | ICD-10-CM | POA: Insufficient documentation

## 2014-10-28 DIAGNOSIS — Z87448 Personal history of other diseases of urinary system: Secondary | ICD-10-CM | POA: Insufficient documentation

## 2014-10-28 DIAGNOSIS — F4310 Post-traumatic stress disorder, unspecified: Secondary | ICD-10-CM | POA: Insufficient documentation

## 2014-10-28 DIAGNOSIS — S93602A Unspecified sprain of left foot, initial encounter: Secondary | ICD-10-CM | POA: Insufficient documentation

## 2014-10-28 DIAGNOSIS — Z7952 Long term (current) use of systemic steroids: Secondary | ICD-10-CM | POA: Insufficient documentation

## 2014-10-28 DIAGNOSIS — Y998 Other external cause status: Secondary | ICD-10-CM | POA: Insufficient documentation

## 2014-10-28 HISTORY — DX: Bronchitis, not specified as acute or chronic: J40

## 2014-10-28 MED ORDER — HYDROCODONE-ACETAMINOPHEN 5-325 MG PO TABS
1.0000 | ORAL_TABLET | Freq: Once | ORAL | Status: AC
  Administered 2014-10-28: 1 via ORAL
  Filled 2014-10-28: qty 1

## 2014-10-28 MED ORDER — NAPROXEN 500 MG PO TABS: 500.0000 mg | ORAL_TABLET | Freq: Two times a day (BID) | ORAL | Status: DC

## 2014-10-28 NOTE — ED Provider Notes (Signed)
CSN: [id number]     Arrival date & time 10/28/14  1343 History   First MD Initiated Contact with Patient 10/28/14 1405     Chief Complaint  Patient presents with  . Foot Injury      HPI  Patient presents for evaluation of left foot pain. Has pain since stepping on a dog bone at home and inverting her foot and twisting it yesterday. Walks with a limp today.  Past Medical History  Diagnosis Date  . Asthma   . Celiac disease   . Anemia   . Renal disorder     mild kidney disease  . Pancreatitis, chronic   . PTSD (post-traumatic stress disorder)   . Night terror disorder   . Depression   . Anxiety   . Bronchitis    Past Surgical History  Procedure Laterality Date  . Foot surgery     No family history on file. History  Substance Use Topics  . Smoking status: Former Smoker    Quit date: 06/09/2007  . Smokeless tobacco: Never Used  . Alcohol Use: Yes     Comment: rare   OB History    No data available     Review of Systems  Musculoskeletal:       Pain in the lateral aspect left foot and on the area of the fourth and fifth toes. No ankle pain. No proximal knee or hip pain.      Allergies  Adhesive; Amoxicillin-pot clavulanate; Aspirin; Gluten meal; and Wheat  Home Medications   Prior to Admission medications   Medication Sig Start Date End Date Taking? Authorizing Provider  albuterol (PROVENTIL HFA;VENTOLIN HFA) 108 (90 BASE) MCG/ACT inhaler Inhale 2 puffs into the lungs every 6 (six) hours as needed. wheezing    Yes Historical Provider, MD  Biotin 5000 MCG CAPS Take by mouth.   Yes Historical Provider, MD  busPIRone (BUSPAR) 5 MG tablet Take 15 mg by mouth 2 (two) times daily.    Yes Historical Provider, MD  CALCIUM PO Take 1 tablet by mouth daily.   Yes Historical Provider, MD  diphenhydrAMINE (BENADRYL) 25 MG tablet Take 25 mg by mouth every 6 (six) hours as needed.   Yes Historical Provider, MD  fluticasone (FLONASE) 50 MCG/ACT nasal spray Place 2 sprays into  both nostrils daily.   Yes Historical Provider, MD  Fluticasone-Salmeterol (ADVAIR) 250-50 MCG/DOSE AEPB Inhale 1 puff into the lungs 2 (two) times daily.   Yes Historical Provider, MD  hydrOXYzine (VISTARIL) 50 MG capsule Take 50 mg by mouth 2 (two) times daily.     Yes Historical Provider, MD  magnesium 30 MG tablet Take 30 mg by mouth 2 (two) times daily.   Yes Historical Provider, MD  pantoprazole (PROTONIX) 40 MG tablet Take 40 mg by mouth daily.   Yes Historical Provider, MD  Prenatal Vit-Fe Fumarate-FA (PRENATAL MULTIVITAMIN) TABS tablet Take 1 tablet by mouth daily at 12 noon.   Yes Historical Provider, MD  Vortioxetine HBr (BRINTELLIX) 20 MG TABS Take by mouth.   Yes Historical Provider, MD  b complex vitamins tablet Take 1 tablet by mouth daily.      Historical Provider, MD  budesonide-formoterol (SYMBICORT) 160-4.5 MCG/ACT inhaler Inhale 2 puffs into the lungs 2 (two) times daily.    Historical Provider, MD  cetirizine (ZYRTEC) 5 MG chewable tablet Chew 2 tablets (10 mg total) by mouth daily. 09/17/11 09/16/12  Ruby Cola, PA-C  citalopram (CELEXA) 40 MG tablet Take 40 mg by mouth  daily.      Historical Provider, MD  mometasone (NASONEX) 50 MCG/ACT nasal spray Place 2 sprays into the nose daily.    Historical Provider, MD  montelukast (SINGULAIR) 10 MG tablet Take 10 mg by mouth at bedtime.    Historical Provider, MD  naproxen (NAPROSYN) 500 MG tablet Take 1 tablet (500 mg total) by mouth 2 (two) times daily. 10/28/14   Rolland Porter, MD  prazosin (MINIPRESS) 1 MG capsule Take 1 mg by mouth at bedtime.    Historical Provider, MD  predniSONE (DELTASONE) 10 MG tablet Take 2 tablets (20 mg total) by mouth 2 (two) times daily. 09/17/11   Catherine Schinlever, PA-C  predniSONE (DELTASONE) 50 MG tablet Take 1 tablet (50 mg total) by mouth daily. 01/02/14   Jerelyn Scott, MD  prenatal vitamin w/FE, FA (PRENATAL 1 + 1) 27-1 MG TABS tablet Take 1 tablet by mouth daily at 12 noon.    Historical  Provider, MD  temazepam (RESTORIL) 15 MG capsule Take 15 mg by mouth at bedtime as needed. For sleep    Historical Provider, MD  topiramate (TOPAMAX) 50 MG tablet Take 50 mg by mouth 2 (two) times daily.      Historical Provider, MD   BP 157/103 mmHg  Pulse 71  Temp(Src) 98.7 F (37.1 C) (Oral)  Resp 16  Ht 5\' 7"  (1.702 m)  Wt 180 lb (81.647 kg)  BMI 28.19 kg/m2  SpO2 94% Physical Exam  Musculoskeletal:       Feet:  Nontender over the malleoli. Nontender over the great toe.    ED Course  Procedures (including critical care time) Labs Review Labs Reviewed - No data to display  Imaging Review Dg Foot Complete Left  10/28/2014   CLINICAL DATA:  Fall yesterday. Foot injury. Swelling in fourth toe.  EXAM: LEFT FOOT - COMPLETE 3+ VIEW  COMPARISON:  None.  FINDINGS: Postoperative changes within the left great toe with fusion across the IP joint. No acute bony abnormality. Specifically, no fracture, subluxation, or dislocation. Soft tissues are intact.  IMPRESSION: No acute bony abnormality.   Electronically Signed   By: 10/30/2014 M.D.   On: 10/28/2014 14:28     EKG Interpretation None      MDM   Final diagnoses:  Foot sprain, left, initial encounter    Normal x-ray. Hardware intact. Plan is nonweightbearing ice elevation and anti-inflammatories. Slowly increase weightbearing as tolerated.    10/30/2014, MD 10/28/14 1452

## 2014-10-28 NOTE — ED Notes (Signed)
Pt reports tripped over her dog's bone yesterday am- c/o pain and swelling in toes and foot- worse is left 4th toe

## 2014-10-28 NOTE — Discharge Instructions (Signed)
Crutches and nonweightbearing until you can do so without pain.  Foot Sprain The muscles and cord like structures which attach muscle to bone (tendons) that surround the feet are made up of units. A foot sprain can occur at the weakest spot in any of these units. This condition is most often caused by injury to or overuse of the foot, as from playing contact sports, or aggravating a previous injury, or from poor conditioning, or obesity. SYMPTOMS  Pain with movement of the foot.  Tenderness and swelling at the injury site.  Loss of strength is present in moderate or severe sprains. THE THREE GRADES OR SEVERITY OF FOOT SPRAIN ARE:  Mild (Grade I): Slightly pulled muscle without tearing of muscle or tendon fibers or loss of strength.  Moderate (Grade II): Tearing of fibers in a muscle, tendon, or at the attachment to bone, with small decrease in strength.  Severe (Grade III): Rupture of the muscle-tendon-bone attachment, with separation of fibers. Severe sprain requires surgical repair. Often repeating (chronic) sprains are caused by overuse. Sudden (acute) sprains are caused by direct injury or over-use. DIAGNOSIS  Diagnosis of this condition is usually by your own observation. If problems continue, a caregiver may be required for further evaluation and treatment. X-rays may be required to make sure there are not breaks in the bones (fractures) present. Continued problems may require physical therapy for treatment. PREVENTION  Use strength and conditioning exercises appropriate for your sport.  Warm up properly prior to working out.  Use athletic shoes that are made for the sport you are participating in.  Allow adequate time for healing. Early return to activities makes repeat injury more likely, and can lead to an unstable arthritic foot that can result in prolonged disability. Mild sprains generally heal in 3 to 10 days, with moderate and severe sprains taking 2 to 10 weeks. Your  caregiver can help you determine the proper time required for healing. HOME CARE INSTRUCTIONS   Apply ice to the injury for 15-20 minutes, 03-04 times per day. Put the ice in a plastic bag and place a towel between the bag of ice and your skin.  An elastic wrap (like an Ace bandage) may be used to keep swelling down.  Keep foot above the level of the heart, or at least raised on a footstool, when swelling and pain are present.  Try to avoid use other than gentle range of motion while the foot is painful. Do not resume use until instructed by your caregiver. Then begin use gradually, not increasing use to the point of pain. If pain does develop, decrease use and continue the above measures, gradually increasing activities that do not cause discomfort, until you gradually achieve normal use.  Use crutches if and as instructed, and for the length of time instructed.  Keep injured foot and ankle wrapped between treatments.  Massage foot and ankle for comfort and to keep swelling down. Massage from the toes up towards the knee.  Only take over-the-counter or prescription medicines for pain, discomfort, or fever as directed by your caregiver. SEEK IMMEDIATE MEDICAL CARE IF:   Your pain and swelling increase, or pain is not controlled with medications.  You have loss of feeling in your foot or your foot turns cold or blue.  You develop new, unexplained symptoms, or an increase of the symptoms that brought you to your caregiver. MAKE SURE YOU:   Understand these instructions.  Will watch your condition.  Will get help right away  if you are not doing well or get worse. Document Released: 11/14/2001 Document Revised: 08/17/2011 Document Reviewed: 01/12/2008 [facility] Patient Information 2015 [place], [place]. This information is not intended to replace advice given to you by your health care provider. Make sure you discuss any questions you have with your health care provider.

## 2015-01-09 ENCOUNTER — Encounter (HOSPITAL_BASED_OUTPATIENT_CLINIC_OR_DEPARTMENT_OTHER): Payer: Self-pay

## 2015-01-09 ENCOUNTER — Emergency Department (HOSPITAL_BASED_OUTPATIENT_CLINIC_OR_DEPARTMENT_OTHER)
Admission: EM | Admit: 2015-01-09 | Discharge: 2015-01-09 | Disposition: A | Discharge status: Home / Self Care | Payer: Self-pay | Attending: Emergency Medicine | Admitting: Emergency Medicine

## 2015-01-09 DIAGNOSIS — Z87448 Personal history of other diseases of urinary system: Secondary | ICD-10-CM | POA: Insufficient documentation

## 2015-01-09 DIAGNOSIS — D649 Anemia, unspecified: Secondary | ICD-10-CM | POA: Insufficient documentation

## 2015-01-09 DIAGNOSIS — Z87891 Personal history of nicotine dependence: Secondary | ICD-10-CM | POA: Insufficient documentation

## 2015-01-09 DIAGNOSIS — J040 Acute laryngitis: Secondary | ICD-10-CM | POA: Insufficient documentation

## 2015-01-09 DIAGNOSIS — F419 Anxiety disorder, unspecified: Secondary | ICD-10-CM | POA: Insufficient documentation

## 2015-01-09 DIAGNOSIS — J069 Acute upper respiratory infection, unspecified: Secondary | ICD-10-CM | POA: Insufficient documentation

## 2015-01-09 DIAGNOSIS — Z880 Allergy status to penicillin: Secondary | ICD-10-CM | POA: Insufficient documentation

## 2015-01-09 DIAGNOSIS — F4310 Post-traumatic stress disorder, unspecified: Secondary | ICD-10-CM | POA: Insufficient documentation

## 2015-01-09 DIAGNOSIS — Z8719 Personal history of other diseases of the digestive system: Secondary | ICD-10-CM | POA: Insufficient documentation

## 2015-01-09 DIAGNOSIS — J45909 Unspecified asthma, uncomplicated: Secondary | ICD-10-CM | POA: Insufficient documentation

## 2015-01-09 DIAGNOSIS — F329 Major depressive disorder, single episode, unspecified: Secondary | ICD-10-CM | POA: Insufficient documentation

## 2015-01-09 DIAGNOSIS — Z79899 Other long term (current) drug therapy: Secondary | ICD-10-CM | POA: Insufficient documentation

## 2015-01-09 DIAGNOSIS — R61 Generalized hyperhidrosis: Secondary | ICD-10-CM | POA: Insufficient documentation

## 2015-01-09 DIAGNOSIS — Z791 Long term (current) use of non-steroidal anti-inflammatories (NSAID): Secondary | ICD-10-CM | POA: Insufficient documentation

## 2015-01-09 DIAGNOSIS — Z7951 Long term (current) use of inhaled steroids: Secondary | ICD-10-CM | POA: Insufficient documentation

## 2015-01-09 MED ORDER — BENZONATATE 100 MG PO CAPS: 100.0000 mg | ORAL_CAPSULE | Freq: Three times a day (TID) | ORAL | Status: DC

## 2015-01-09 MED ORDER — FLUTICASONE PROPIONATE 50 MCG/ACT NA SUSP: 2.0000 | Freq: Every day | NASAL | Status: DC

## 2015-01-09 NOTE — Discharge Instructions (Signed)
Use nasal spray as prescribed. Use Tessalon as prescribed. Rest and stay well-hydrated.  Laryngitis At the top of your windpipe is your voice box. It is the source of your voice. Inside your voice box are 2 bands of muscles called vocal cords. When you breathe, your vocal cords are relaxed and open so that air can get into the lungs. When you decide to say something, these cords come together and vibrate. The sound from these vibrations goes into your throat and comes out through your mouth as sound. Laryngitis is an inflammation of the vocal cords that causes hoarseness, cough, loss of voice, sore throat, and dry throat. Laryngitis can be temporary (acute) or long-term (chronic). Most cases of acute laryngitis improve with time.Chronic laryngitis lasts for more than 3 weeks. CAUSES Laryngitis can often be related to excessive smoking, talking, or yelling, as well as inhalation of toxic fumes and allergies. Acute laryngitis is usually caused by a viral infection, vocal strain, measles or mumps, or bacterial infections. Chronic laryngitis is usually caused by vocal cord strain, vocal cord injury, postnasal drip, growths on the vocal cords, or acid reflux. SYMPTOMS   Cough.  Sore throat.  Dry throat. RISK FACTORS  Respiratory infections.  Exposure to irritating substances, such as cigarette smoke, excessive amounts of alcohol, stomach acids, and workplace chemicals.  Voice trauma, such as vocal cord injury from shouting or speaking too loud. DIAGNOSIS  Your cargiver will perform a physical exam. During the physical exam, your caregiver will examine your throat. The most common sign of laryngitis is hoarseness. Laryngoscopy may be necessary to confirm the diagnosis of this condition. This procedure allows your caregiver to look into the larynx. HOME CARE INSTRUCTIONS  Drink enough fluids to keep your urine clear or pale yellow.  Rest until you no longer have symptoms or as directed by your  caregiver.  Breathe in moist air.  Take all medicine as directed by your caregiver.  Do not smoke.  Talk as little as possible (this includes whispering).  Write on paper instead of talking until your voice is back to normal.  Follow up with your caregiver if your condition has not improved after 10 days. SEEK MEDICAL CARE IF:   You have trouble breathing.  You cough up blood.  You have persistent fever.  You have increasing pain.  You have difficulty swallowing. MAKE SURE YOU:  Understand these instructions.  Will watch your condition.  Will get help right away if you are not doing well or get worse. Document Released: 05/25/2005 Document Revised: 08/17/2011 Document Reviewed: 07/31/2010 [facility] Patient Information 2015 [place], [place]. This information is not intended to replace advice given to you by your health care provider. Make sure you discuss any questions you have with your health care provider.  Upper Respiratory Infection, Adult An upper respiratory infection (URI) is also sometimes known as the common cold. The upper respiratory tract includes the nose, sinuses, throat, trachea, and bronchi. Bronchi are the airways leading to the lungs. Most people improve within 1 week, but symptoms can last up to 2 weeks. A residual cough may last even longer.  CAUSES Many different viruses can infect the tissues lining the upper respiratory tract. The tissues become irritated and inflamed and often become very moist. Mucus production is also common. A cold is contagious. You can easily spread the virus to others by oral contact. This includes kissing, sharing a glass, coughing, or sneezing. Touching your mouth or nose and then touching a surface, which  is then touched by another person, can also spread the virus. SYMPTOMS  Symptoms typically develop 1 to 3 days after you come in contact with a cold virus. Symptoms vary from person to person. They may include:  Runny  nose.  Sneezing.  Nasal congestion.  Sinus irritation.  Sore throat.  Loss of voice (laryngitis).  Cough.  Fatigue.  Muscle aches.  Loss of appetite.  Headache.  Low-grade fever. DIAGNOSIS  You might diagnose your own cold based on familiar symptoms, since most people get a cold 2 to 3 times a year. Your caregiver can confirm this based on your exam. Most importantly, your caregiver can check that your symptoms are not due to another disease such as strep throat, sinusitis, pneumonia, asthma, or epiglottitis. Blood tests, throat tests, and X-rays are not necessary to diagnose a common cold, but they may sometimes be helpful in excluding other more serious diseases. Your caregiver will decide if any further tests are required. RISKS AND COMPLICATIONS  You may be at risk for a more severe case of the common cold if you smoke cigarettes, have chronic heart disease (such as heart failure) or lung disease (such as asthma), or if you have a weakened immune system. The very young and very old are also at risk for more serious infections. Bacterial sinusitis, middle ear infections, and bacterial pneumonia can complicate the common cold. The common cold can worsen asthma and chronic obstructive pulmonary disease (COPD). Sometimes, these complications can require emergency medical care and may be life-threatening. PREVENTION  The best way to protect against getting a cold is to practice good hygiene. Avoid oral or hand contact with people with cold symptoms. Wash your hands often if contact occurs. There is no clear evidence that vitamin C, vitamin E, echinacea, or exercise reduces the chance of developing a cold. However, it is always recommended to get plenty of rest and practice good nutrition. TREATMENT  Treatment is directed at relieving symptoms. There is no cure. Antibiotics are not effective, because the infection is caused by a virus, not by bacteria. Treatment may include:  Increased  fluid intake. Sports drinks offer valuable electrolytes, sugars, and fluids.  Breathing heated mist or steam (vaporizer or shower).  Eating chicken soup or other clear broths, and maintaining good nutrition.  Getting plenty of rest.  Using gargles or lozenges for comfort.  Controlling fevers with ibuprofen or acetaminophen as directed by your caregiver.  Increasing usage of your inhaler if you have asthma. Zinc gel and zinc lozenges, taken in the first 24 hours of the common cold, can shorten the duration and lessen the severity of symptoms. Pain medicines may help with fever, muscle aches, and throat pain. A variety of non-prescription medicines are available to treat congestion and runny nose. Your caregiver can make recommendations and may suggest nasal or lung inhalers for other symptoms.  HOME CARE INSTRUCTIONS   Only take over-the-counter or prescription medicines for pain, discomfort, or fever as directed by your caregiver.  Use a warm mist humidifier or inhale steam from a shower to increase air moisture. This may keep secretions moist and make it easier to breathe.  Drink enough water and fluids to keep your urine clear or pale yellow.  Rest as needed.  Return to work when your temperature has returned to normal or as your caregiver advises. You may need to stay home longer to avoid infecting others. You can also use a face mask and careful hand washing to prevent spread of  the virus. SEEK MEDICAL CARE IF:   After the first few days, you feel you are getting worse rather than better.  You need your caregiver's advice about medicines to control symptoms.  You develop chills, worsening shortness of breath, or brown or red sputum. These may be signs of pneumonia.  You develop yellow or brown nasal discharge or pain in the face, especially when you bend forward. These may be signs of sinusitis.  You develop a fever, swollen neck glands, pain with swallowing, or white areas in  the back of your throat. These may be signs of strep throat. SEEK IMMEDIATE MEDICAL CARE IF:   You have a fever.  You develop severe or persistent headache, ear pain, sinus pain, or chest pain.  You develop wheezing, a prolonged cough, cough up blood, or have a change in your usual mucus (if you have chronic lung disease).  You develop sore muscles or a stiff neck. Document Released: 11/18/2000 Document Revised: 08/17/2011 Document Reviewed: 08/30/2013 [facility] Patient Information 2015 [place], [place]. This information is not intended to replace advice given to you by your health care provider. Make sure you discuss any questions you have with your health care provider.

## 2015-01-09 NOTE — ED Notes (Signed)
C/o chills, hot/cold, cough, sore throat, laryngitis x 2 days

## 2015-01-09 NOTE — ED Provider Notes (Signed)
CSN: [id number]     Arrival date & time 01/09/15  1226 History   First MD Initiated Contact with Patient 01/09/15 1238     Chief Complaint  Patient presents with  . Chills     (Consider location/radiation/quality/duration/timing/severity/associated sxs/prior Treatment) HPI Comments: 54 year old female complaining of chills, sweats, sore throat, hoarse voice, nasal congestion, dry cough and sneezing 2 days. States "it all started with a sneeze", followed by a hoarse voice and dry cough. States she has trouble breathing through her nose but it is draining. No aggravating or alleviating factors. States she has "no medicine at home to take". Denies fevers. No sick contacts but states other people at church 3 days ago seemed to be sick.  The history is provided by the patient.    Past Medical History  Diagnosis Date  . Asthma   . Celiac disease   . Anemia   . Renal disorder     mild kidney disease  . Pancreatitis, chronic   . PTSD (post-traumatic stress disorder)   . Night terror disorder   . Depression   . Anxiety   . Bronchitis    Past Surgical History  Procedure Laterality Date  . Foot surgery     No family history on file. History  Substance Use Topics  . Smoking status: Former Smoker    Quit date: 06/09/2007  . Smokeless tobacco: Never Used  . Alcohol Use: Yes     Comment: rare   OB History    No data available     Review of Systems  Constitutional: Positive for chills and diaphoresis.  HENT: Positive for congestion, rhinorrhea, sore throat and voice change.   Respiratory: Positive for cough.   All other systems reviewed and are negative.     Allergies  Adhesive; Amoxicillin-pot clavulanate; Aspirin; Gluten meal; Lactose intolerance (gi); and Wheat  Home Medications   Prior to Admission medications   Medication Sig Start Date End Date Taking? Authorizing Provider  albuterol (PROVENTIL HFA;VENTOLIN HFA) 108 (90 BASE) MCG/ACT inhaler Inhale 2 puffs into the  lungs every 6 (six) hours as needed. wheezing     Historical Provider, MD  b complex vitamins tablet Take 1 tablet by mouth daily.      Historical Provider, MD  benzonatate (TESSALON) 100 MG capsule Take 1 capsule (100 mg total) by mouth every 8 (eight) hours. 01/09/15   Kathrynn Speed, PA-C  Biotin 5000 MCG CAPS Take by mouth.    Historical Provider, MD  busPIRone (BUSPAR) 5 MG tablet Take 15 mg by mouth 2 (two) times daily.     Historical Provider, MD  CALCIUM PO Take 1 tablet by mouth daily.    Historical Provider, MD  cetirizine (ZYRTEC) 5 MG chewable tablet Chew 2 tablets (10 mg total) by mouth daily. 09/17/11 09/16/12  Ruby Cola, PA-C  diphenhydrAMINE (BENADRYL) 25 MG tablet Take 25 mg by mouth every 6 (six) hours as needed.    Historical Provider, MD  fluticasone (FLONASE) 50 MCG/ACT nasal spray Place 2 sprays into both nostrils daily. 01/09/15   Kathrynn Speed, PA-C  Fluticasone-Salmeterol (ADVAIR) 250-50 MCG/DOSE AEPB Inhale 1 puff into the lungs 2 (two) times daily.    Historical Provider, MD  hydrOXYzine (VISTARIL) 50 MG capsule Take 50 mg by mouth 2 (two) times daily.      Historical Provider, MD  magnesium 30 MG tablet Take 30 mg by mouth 2 (two) times daily.    Historical Provider, MD  naproxen (NAPROSYN) 500 MG  tablet Take 1 tablet (500 mg total) by mouth 2 (two) times daily. 10/28/14   Rolland Porter, MD  pantoprazole (PROTONIX) 40 MG tablet Take 40 mg by mouth daily.    Historical Provider, MD  prazosin (MINIPRESS) 1 MG capsule Take 1 mg by mouth at bedtime.    Historical Provider, MD  Prenatal Vit-Fe Fumarate-FA (PRENATAL MULTIVITAMIN) TABS tablet Take 1 tablet by mouth daily at 12 noon.    Historical Provider, MD  prenatal vitamin w/FE, FA (PRENATAL 1 + 1) 27-1 MG TABS tablet Take 1 tablet by mouth daily at 12 noon.    Historical Provider, MD  topiramate (TOPAMAX) 50 MG tablet Take 50 mg by mouth 2 (two) times daily.      Historical Provider, MD  Vortioxetine HBr (BRINTELLIX) 20 MG  TABS Take by mouth.    Historical Provider, MD   BP 137/93 mmHg  Pulse 97  Temp(Src) 98.5 F (36.9 C) (Oral)  Resp 18  Ht 5\' 7"  (1.702 m)  Wt 190 lb (86.183 kg)  BMI 29.75 kg/m2  SpO2 98% Physical Exam  Constitutional: She is oriented to person, place, and time. She appears well-developed and well-nourished. No distress.  HENT:  Head: Normocephalic and atraumatic.  Right Ear: Tympanic membrane normal.  Left Ear: Tympanic membrane normal.  Nasal congestion, mucosal edema, postnasal drip. No post oropharyngeal erythema, edema or exudate.  Eyes: Conjunctivae and EOM are normal.  Neck: Normal range of motion. Neck supple.  Cardiovascular: Normal rate, regular rhythm and normal heart sounds.   Pulmonary/Chest: Effort normal and breath sounds normal. No respiratory distress.  Musculoskeletal: Normal range of motion. She exhibits no edema.  Lymphadenopathy:    She has no cervical adenopathy.  Neurological: She is alert and oriented to person, place, and time. No sensory deficit.  Skin: Skin is warm and dry.  Psychiatric: She has a normal mood and affect. Her behavior is normal.  Nursing note and vitals reviewed.   ED Course  Procedures (including critical care time) Labs Review Labs Reviewed - No data to display  Imaging Review No results found.   EKG Interpretation None      MDM   Final diagnoses:  URI (upper respiratory infection)  Laryngitis   Nontoxic appearing, NAD. AF VSS. Symptoms ongoing for 2 days. Lungs clear. Discussed symptomatic treatment. Rx Tessalon and Flonase. Follow-up with PCP. Stable for discharge. Return precautions given. Patient states understanding of treatment care plan and is agreeable.   , PA-C 01/09/15 1249  03/11/15, MD 01/09/15 [phone]

## 2015-01-09 NOTE — ED Notes (Signed)
Pt not seen by this RN- see PA-C physical assessment

## 2016-10-28 LAB — GLUCOSE, POCT (MANUAL RESULT ENTRY): POC Glucose: 207 mg/dl — AB (ref 70–99)

## 2018-07-04 ENCOUNTER — Encounter (HOSPITAL_BASED_OUTPATIENT_CLINIC_OR_DEPARTMENT_OTHER): Payer: Self-pay | Admitting: *Deleted

## 2018-07-04 ENCOUNTER — Emergency Department (HOSPITAL_BASED_OUTPATIENT_CLINIC_OR_DEPARTMENT_OTHER): Payer: Medicaid Other

## 2018-07-04 ENCOUNTER — Emergency Department (HOSPITAL_BASED_OUTPATIENT_CLINIC_OR_DEPARTMENT_OTHER)
Admission: EM | Admit: 2018-07-04 | Discharge: 2018-07-04 | Disposition: A | Discharge status: Home / Self Care | Payer: Medicaid Other | Attending: Emergency Medicine | Admitting: Emergency Medicine

## 2018-07-04 ENCOUNTER — Other Ambulatory Visit: Payer: Self-pay

## 2018-07-04 DIAGNOSIS — R1110 Vomiting, unspecified: Secondary | ICD-10-CM | POA: Insufficient documentation

## 2018-07-04 DIAGNOSIS — I10 Essential (primary) hypertension: Secondary | ICD-10-CM | POA: Diagnosis not present

## 2018-07-04 DIAGNOSIS — J45909 Unspecified asthma, uncomplicated: Secondary | ICD-10-CM | POA: Insufficient documentation

## 2018-07-04 DIAGNOSIS — Z79899 Other long term (current) drug therapy: Secondary | ICD-10-CM | POA: Insufficient documentation

## 2018-07-04 DIAGNOSIS — Z87891 Personal history of nicotine dependence: Secondary | ICD-10-CM | POA: Diagnosis not present

## 2018-07-04 DIAGNOSIS — R070 Pain in throat: Secondary | ICD-10-CM | POA: Diagnosis present

## 2018-07-04 DIAGNOSIS — R05 Cough: Secondary | ICD-10-CM | POA: Diagnosis not present

## 2018-07-04 DIAGNOSIS — R059 Cough, unspecified: Secondary | ICD-10-CM

## 2018-07-04 DIAGNOSIS — E119 Type 2 diabetes mellitus without complications: Secondary | ICD-10-CM | POA: Diagnosis not present

## 2018-07-04 LAB — GROUP A STREP BY PCR: Group A Strep by PCR: NOT DETECTED

## 2018-07-04 MED ORDER — BENZONATATE 100 MG PO CAPS: 100.0000 mg | ORAL_CAPSULE | Freq: Three times a day (TID) | ORAL | 0 refills | Status: AC

## 2018-07-04 MED ORDER — FLUTICASONE PROPIONATE 50 MCG/ACT NA SUSP: 1.0000 | Freq: Every day | NASAL | 0 refills | Status: AC

## 2018-07-04 NOTE — ED Triage Notes (Signed)
Sore throat after vomiting since yesterday.

## 2018-07-04 NOTE — Discharge Instructions (Signed)
Your xray today was normal. I have provided medication to help with your symptoms please use this medication as directed. If you experience any shortness of breath, chest pain or worsening symptoms please return to the ED. Please follow up with your PCP in 1 week for reevaluation of your symptoms.

## 2018-07-04 NOTE — ED Provider Notes (Signed)
[facility] EMERGENCY DEPARTMENT Provider Note   CSN: [id number] Arrival date & time: 07/04/18  1747     History   Chief Complaint Chief Complaint  Patient presents with  . Sore Throat  . Emesis    HPI Savannah PianoSonia Clark is a 58 y.o. female.  58 y.o female with a PMH of HTN, DM presents to the ED with a chief complaint of sore throat and emesis x yesterday. Patient reports she had nausea yesterday and had multiple episodes of emesis. She reports a sore throat after episode of emesis. During ED visit today she reported having a cough spell, which was relieve by using her inhaler. She reports a sharp pain to get throat. She endorses some rhinorrhea and some shortness of breath. Patient denies any therapy for relieve in symptoms aside from inhaler use. She denies any chest pain, fever, abdominal pain or bowel bladder complaints.      Past Medical History:  Diagnosis Date  . Anemia   . Anxiety   . Asthma   . Bronchitis   . Celiac disease   . Depression   . Night terror disorder   . Pancreatitis, chronic (HCC)   . PTSD (post-traumatic stress disorder)   . Renal disorder    mild kidney disease    There are no active problems to display for this patient.   Past Surgical History:  Procedure Laterality Date  . FOOT SURGERY       OB History   No obstetric history on file.      Home Medications    Prior to Admission medications   Medication Sig Start Date End Date Taking? Authorizing Provider  albuterol (PROVENTIL HFA;VENTOLIN HFA) 108 (90 BASE) MCG/ACT inhaler Inhale 2 puffs into the lungs every 6 (six) hours as needed. wheezing    Yes [provider]  b complex vitamins tablet Take 1 tablet by mouth daily.     Yes [provider]  Biotin 5000 MCG CAPS Take by mouth.   Yes [provider]  buPROPion HCl (WELLBUTRIN PO) Take by mouth.   Yes [provider]  busPIRone (BUSPAR) 5 MG tablet Take 15 mg by mouth 2 (two) times  daily.    Yes [provider]  CALCIUM PO Take 1 tablet by mouth daily.   Yes [provider]  diphenhydrAMINE (BENADRYL) 25 MG tablet Take 25 mg by mouth every 6 (six) hours as needed.   Yes [provider]  Fluticasone-Salmeterol (ADVAIR) 250-50 MCG/DOSE AEPB Inhale 1 puff into the lungs 2 (two) times daily.   Yes [provider]  hydrOXYzine (VISTARIL) 50 MG capsule Take 50 mg by mouth 2 (two) times daily.     Yes [provider]  magnesium 30 MG tablet Take 30 mg by mouth 2 (two) times daily.   Yes [provider]  Vortioxetine HBr (BRINTELLIX) 20 MG TABS Take by mouth.   Yes [provider]  benzonatate (TESSALON) 100 MG capsule Take 1 capsule (100 mg total) by mouth every 8 (eight) hours for 5 days. 07/04/18 07/09/18  Claude MangesSoto, Aruna Nestler, PA-C  cetirizine (ZYRTEC) 5 MG chewable tablet Chew 2 tablets (10 mg total) by mouth daily. 09/17/11 09/16/12  Schinlever, Santina Evansatherine, PA-C  fluticasone (FLONASE) 50 MCG/ACT nasal spray Place 1 spray into both nostrils daily for 5 doses. 07/04/18 07/09/18  Claude MangesSoto, Jacolyn Joaquin, PA-C  naproxen (NAPROSYN) 500 MG tablet Take 1 tablet (500 mg total) by mouth 2 (two) times daily. 10/28/14   Rolland PorterJames, Mark,  MD  pantoprazole (PROTONIX) 40 MG tablet Take 40 mg by mouth daily.    [provider]  prazosin (MINIPRESS) 1 MG capsule Take 1 mg by mouth at bedtime.    [provider]  Prenatal Vit-Fe Fumarate-FA (PRENATAL MULTIVITAMIN) TABS tablet Take 1 tablet by mouth daily at 12 noon.    [provider]  prenatal vitamin w/FE, FA (PRENATAL 1 + 1) 27-1 MG TABS tablet Take 1 tablet by mouth daily at 12 noon.    [provider]  topiramate (TOPAMAX) 50 MG tablet Take 50 mg by mouth 2 (two) times daily.      [provider]    Family History No family history on file.  Social History Social History   Tobacco Use  . Smoking status: Former Smoker    Last attempt to quit: 06/09/2007     Years since quitting: 11.0  . Smokeless tobacco: Never Used  Substance Use Topics  . Alcohol use: Yes    Comment: rare  . Drug use: No     Allergies   Adhesive [tape]; Amoxicillin-pot clavulanate; Aspirin; Gluten meal; Lactose intolerance (gi); and Wheat   Review of Systems Review of Systems  Constitutional: Negative for chills and fever.  HENT: Positive for rhinorrhea, sinus pressure and sore throat. Negative for ear pain.   Eyes: Negative for pain and visual disturbance.  Respiratory: Positive for cough and shortness of breath. Negative for wheezing.   Cardiovascular: Negative for chest pain and palpitations.  Gastrointestinal: Negative for abdominal pain and vomiting.  Genitourinary: Negative for dysuria and hematuria.  Musculoskeletal: Negative for arthralgias and back pain.  Skin: Negative for color change and rash.  Neurological: Negative for seizures and syncope.  All other systems reviewed and are negative.    Physical Exam Updated Vital Signs BP (!) 135/111 (BP Location: Left Arm)   Pulse 99   Temp 98 F (36.7 C) (Oral)   Resp 18   Ht 5\' 7"  (1.702 m)   Wt 81.6 kg   SpO2 100%   BMI 28.19 kg/m   Physical Exam Vitals signs and nursing note reviewed.  Constitutional:      General: She is not in acute distress.    Appearance: She is well-developed.  HENT:     Head: Normocephalic and atraumatic.     Mouth/Throat:     Pharynx: No oropharyngeal exudate.  Eyes:     Pupils: Pupils are equal, round, and reactive to light.  Neck:     Musculoskeletal: Normal range of motion.  Cardiovascular:     Rate and Rhythm: Regular rhythm.     Heart sounds: Normal heart sounds.  Pulmonary:     Effort: Pulmonary effort is normal. No respiratory distress.     Breath sounds: Examination of the right-lower field reveals decreased breath sounds. Examination of the left-lower field reveals decreased breath sounds. Decreased breath sounds present. No wheezing.  Abdominal:      General: Bowel sounds are normal. There is no distension.     Palpations: Abdomen is soft.     Tenderness: There is no abdominal tenderness.  Musculoskeletal:        General: No tenderness or deformity.     Right lower leg: No edema.     Left lower leg: No edema.  Skin:    General: Skin is warm and dry.  Neurological:     Mental Status: She is alert and oriented to person, place, and time.      ED Treatments /  Results  Labs (all labs ordered are listed, but only abnormal results are displayed) Labs Reviewed  GROUP A STREP BY PCR    EKG None  Radiology Dg Chest 2 View  Result Date: 07/04/2018 CLINICAL DATA:  Shortness of breath. EXAM: CHEST - 2 VIEW COMPARISON:  01/02/2014 FINDINGS: The cardiomediastinal contours are normal. Mild bronchitic change. Pulmonary vasculature is normal. No consolidation, pleural effusion, or pneumothorax. No acute osseous abnormalities are seen. IMPRESSION: Mild bronchitic change which can be seen with bronchitis or asthma. Electronically Signed   By: Narda RutherfordMelanie  Sanford M.D.   On: 07/04/2018 19:43    Procedures Procedures (including critical care time)  Medications Ordered in ED Medications - No data to display   Initial Impression / Assessment and Plan / ED Course  I have reviewed the triage vital signs and the nursing notes.  Pertinent labs & imaging results that were available during my care of the patient were reviewed by me and considered in my medical decision making (see chart for details).    Patient presents with postussive emesis x yesterday. [facility]  During evaluation patient has erythema to the oropharynx, no exudates or tonsillar abscesses.  PCR strep pharyngitis was sent which was negative.  During her ED visit she had another episode of coughing spell, this was relieved by her inhaler she does have a previous history of asthma.  X-ray obtained to rule out any occult infection, negative chest  x-ray.  Reports her rhinorrhea and other symptoms have improved while she is been in the ED, at this time will have patient follow-up with PCP will [facility] along with Flonase to help with her symptoms.HR is slightly elevated 99 this is consistent with her previous visits around the same range.  Patient is calm comfortably resting with friend at the bedside brain.  Return precautions provided at length.  Patient stable for discharge at this time, no further emergent work-up warranted.  Final Clinical Impressions(s) / ED Diagnoses   Final diagnoses:  Cough    ED Discharge Orders         Ordered    benzonatate (TESSALON) 100 MG capsule  Every 8 hours     07/04/18 2018    fluticasone (FLONASE) 50 MCG/ACT nasal spray  Daily     07/04/18 2018           Claude MangesSoto, Ameri Cahoon, PA-C 07/04/18 2024    Virgina Norfolkuratolo, Adam, DO 07/05/18 0106

## 2019-04-05 ENCOUNTER — Encounter (HOSPITAL_BASED_OUTPATIENT_CLINIC_OR_DEPARTMENT_OTHER): Payer: Self-pay | Admitting: *Deleted

## 2019-04-05 ENCOUNTER — Other Ambulatory Visit: Payer: Self-pay

## 2019-04-05 ENCOUNTER — Emergency Department (HOSPITAL_BASED_OUTPATIENT_CLINIC_OR_DEPARTMENT_OTHER)
Admission: EM | Admit: 2019-04-05 | Discharge: 2019-04-05 | Disposition: A | Discharge status: Home / Self Care | Payer: Medicaid Other | Attending: Emergency Medicine | Admitting: Emergency Medicine

## 2019-04-05 DIAGNOSIS — Z87891 Personal history of nicotine dependence: Secondary | ICD-10-CM | POA: Insufficient documentation

## 2019-04-05 DIAGNOSIS — L0390 Cellulitis, unspecified: Secondary | ICD-10-CM | POA: Insufficient documentation

## 2019-04-05 DIAGNOSIS — Z79899 Other long term (current) drug therapy: Secondary | ICD-10-CM | POA: Diagnosis not present

## 2019-04-05 DIAGNOSIS — L539 Erythematous condition, unspecified: Secondary | ICD-10-CM | POA: Diagnosis present

## 2019-04-05 DIAGNOSIS — J45909 Unspecified asthma, uncomplicated: Secondary | ICD-10-CM | POA: Insufficient documentation

## 2019-04-05 MED ORDER — CLINDAMYCIN HCL 300 MG PO CAPS: 300.0000 mg | ORAL_CAPSULE | Freq: Four times a day (QID) | ORAL | 0 refills | Status: DC

## 2019-04-05 MED ORDER — ONDANSETRON HCL 4 MG/2ML IJ SOLN
4.0000 mg | Freq: Once | INTRAMUSCULAR | Status: AC
  Administered 2019-04-05: 4 mg via INTRAVENOUS
  Filled 2019-04-05: qty 2

## 2019-04-05 MED ORDER — CLINDAMYCIN PHOSPHATE 600 MG/50ML IV SOLN
600.0000 mg | Freq: Once | INTRAVENOUS | Status: AC
  Administered 2019-04-05: 19:00:00 600 mg via INTRAVENOUS
  Filled 2019-04-05: qty 50

## 2019-04-05 NOTE — Discharge Instructions (Signed)
Clindamycin as prescribed as needed for pain.  Follow-up with primary doctor if not improving in the next few days, and return to the ER if you develop increased pain, increased redness, streaks up and down the leg, high fevers, or other new and concerning symptoms.

## 2019-04-05 NOTE — ED Notes (Signed)
ED Provider at bedside. 

## 2019-04-05 NOTE — ED Provider Notes (Signed)
[facility] EMERGENCY DEPARTMENT Provider Note   CSN: [id number] Arrival date & time: 04/05/19  1751     History   Chief Complaint Chief Complaint  Patient presents with  . Wound Infection    HPI Savannah Clark is a 58 y.o. female.     Patient is a 58 year old female with history of anemia, asthma.  She presents today for evaluation of rash.  Patient states that she gets insect bites routinely every summer.  She has multiple areas of excoriation to both lower legs, however one has now become red, swollen, and painful.  She was seen 2 days ago by her primary doctor and was given a shot of an antibiotic.  She was to get a prescription filled, however was unaware of this so has not been on antibiotics since.  She presents today with worsening pain and swelling.  She denies fevers or chills.  The history is provided by the patient.    Past Medical History:  Diagnosis Date  . Anemia   . Anxiety   . Asthma   . Bronchitis   . Celiac disease   . Depression   . Night terror disorder   . Pancreatitis, chronic (HCC)   . PTSD (post-traumatic stress disorder)   . Renal disorder    mild kidney disease    There are no active problems to display for this patient.   Past Surgical History:  Procedure Laterality Date  . FOOT SURGERY       OB History   No obstetric history on file.      Home Medications    Prior to Admission medications   Medication Sig Start Date End Date Taking? Authorizing Provider  albuterol (PROVENTIL HFA;VENTOLIN HFA) 108 (90 BASE) MCG/ACT inhaler Inhale 2 puffs into the lungs every 6 (six) hours as needed. wheezing     [provider]  b complex vitamins tablet Take 1 tablet by mouth daily.      [provider]  Biotin 5000 MCG CAPS Take by mouth.    [provider]  buPROPion HCl (WELLBUTRIN PO) Take by mouth.    [provider]  busPIRone (BUSPAR) 5 MG tablet Take 15 mg by mouth 2 (two) times daily.      [provider]  CALCIUM PO Take 1 tablet by mouth daily.    [provider]  cetirizine (ZYRTEC) 5 MG chewable tablet Chew 2 tablets (10 mg total) by mouth daily. 09/17/11 09/16/12  Schinlever, Santina Evans, PA-C  diphenhydrAMINE (BENADRYL) 25 MG tablet Take 25 mg by mouth every 6 (six) hours as needed.    [provider]  fluticasone (FLONASE) 50 MCG/ACT nasal spray Place 1 spray into both nostrils daily for 5 doses. 07/04/18 07/09/18  Claude Manges, PA-C  Fluticasone-Salmeterol (ADVAIR) 250-50 MCG/DOSE AEPB Inhale 1 puff into the lungs 2 (two) times daily.    [provider]  hydrOXYzine (VISTARIL) 50 MG capsule Take 50 mg by mouth 2 (two) times daily.      [provider]  magnesium 30 MG tablet Take 30 mg by mouth 2 (two) times daily.    [provider]  naproxen (NAPROSYN) 500 MG tablet Take 1 tablet (500 mg total) by mouth 2 (two) times daily. 10/28/14   Rolland Porter, MD  pantoprazole (PROTONIX) 40 MG tablet Take 40 mg by mouth daily.    [provider]  prazosin (MINIPRESS) 1 MG capsule Take 1 mg by mouth at bedtime.    [provider]  Prenatal Vit-Fe Fumarate-FA (PRENATAL MULTIVITAMIN) TABS tablet Take 1 tablet by mouth daily at 12 noon.    [provider]  prenatal vitamin w/FE, FA (PRENATAL 1 + 1) 27-1 MG TABS tablet Take 1 tablet by mouth daily at 12 noon.    [provider]  topiramate (TOPAMAX) 50 MG tablet Take 50 mg by mouth 2 (two) times daily.      [provider]  Vortioxetine HBr (BRINTELLIX) 20 MG TABS Take by mouth.    [provider]    Family History History reviewed. No pertinent family history.  Social History Social History   Tobacco Use  . Smoking status: Former Smoker    Quit date: 06/09/2007    Years since quitting: 11.8  . Smokeless tobacco: Never Used  Substance Use Topics  . Alcohol use: Yes    Comment: rare  . Drug use: No     Allergies   Adhesive  [tape], Amoxicillin-pot clavulanate, Aspirin, Farxiga [dapagliflozin], Gluten meal, Januvia [sitagliptin], Lactose intolerance (gi), and Wheat   Review of Systems Review of Systems  All other systems reviewed and are negative.    Physical Exam Updated Vital Signs BP (!) 151/100   Pulse 98   Temp 98.7 F (37.1 C)   Resp 16   Ht 5\' 7"  (1.702 m)   Wt 77.1 kg   BMI 26.63 kg/m   Physical Exam Vitals signs and nursing note reviewed.  Constitutional:      General: She is not in acute distress.    Appearance: Normal appearance. She is not ill-appearing.  HENT:     Head: Normocephalic and atraumatic.  Pulmonary:     Effort: Pulmonary effort is normal.  Skin:    General: Skin is warm and dry.     Comments: There are multiple excoriated areas to both lower extremities.  To the right anterior shin, there is an area of swelling with purulence that appears to be a possible developing abscess.  There is surrounding erythema.  Neurological:     Mental Status: She is alert and oriented to person, place, and time.        ED Treatments / Results  Labs (all labs ordered are listed, but only abnormal results are displayed) Labs Reviewed - No data to display  EKG None  Radiology No results found.  Procedures Procedures (including critical care time)  Medications Ordered in ED Medications  clindamycin (CLEOCIN) IVPB 600 mg (has no administration in time range)  ondansetron (ZOFRAN) injection 4 mg (has no administration in time range)     Initial Impression / Assessment and Plan / ED Course  I have reviewed the triage vital signs and the nursing notes.  Pertinent labs & imaging results that were available during my care of the patient were reviewed by me and considered in my medical decision making (see chart for details).  Patient given IV clindamycin and will be discharged on oral clindamycin.  She is to return as needed if she is not improving in the next 2 days, or if  she develops high fever or worsening pain, increased redness, or streaks up and down the leg.  Final Clinical Impressions(s) / ED Diagnoses   Final diagnoses:  None    ED Discharge Orders    None       Veryl Speak, MD 04/05/19 2105

## 2019-04-05 NOTE — ED Triage Notes (Signed)
pt c/o right lower leg wound infection x 4 days , seen by PMD on 3 days ago given  ABX injection

## 2019-04-05 NOTE — ED Notes (Signed)
DSD to right shin wound.

## 2019-04-07 ENCOUNTER — Emergency Department (HOSPITAL_BASED_OUTPATIENT_CLINIC_OR_DEPARTMENT_OTHER)
Admission: EM | Admit: 2019-04-07 | Discharge: 2019-04-08 | Disposition: A | Discharge status: Home / Self Care | Payer: Medicaid Other | Attending: Emergency Medicine | Admitting: Emergency Medicine

## 2019-04-07 ENCOUNTER — Emergency Department (HOSPITAL_BASED_OUTPATIENT_CLINIC_OR_DEPARTMENT_OTHER): Payer: Medicaid Other

## 2019-04-07 ENCOUNTER — Encounter (HOSPITAL_BASED_OUTPATIENT_CLINIC_OR_DEPARTMENT_OTHER): Payer: Self-pay | Admitting: *Deleted

## 2019-04-07 ENCOUNTER — Telehealth: Payer: Medicaid Other | Admitting: Nurse Practitioner

## 2019-04-07 ENCOUNTER — Other Ambulatory Visit: Payer: Self-pay

## 2019-04-07 DIAGNOSIS — Z4800 Encounter for change or removal of nonsurgical wound dressing: Secondary | ICD-10-CM | POA: Diagnosis not present

## 2019-04-07 DIAGNOSIS — L089 Local infection of the skin and subcutaneous tissue, unspecified: Secondary | ICD-10-CM | POA: Diagnosis not present

## 2019-04-07 DIAGNOSIS — Z5189 Encounter for other specified aftercare: Secondary | ICD-10-CM

## 2019-04-07 DIAGNOSIS — L03115 Cellulitis of right lower limb: Secondary | ICD-10-CM

## 2019-04-07 DIAGNOSIS — J45909 Unspecified asthma, uncomplicated: Secondary | ICD-10-CM | POA: Insufficient documentation

## 2019-04-07 DIAGNOSIS — W57XXXD Bitten or stung by nonvenomous insect and other nonvenomous arthropods, subsequent encounter: Secondary | ICD-10-CM | POA: Insufficient documentation

## 2019-04-07 DIAGNOSIS — R112 Nausea with vomiting, unspecified: Secondary | ICD-10-CM | POA: Diagnosis not present

## 2019-04-07 DIAGNOSIS — Z23 Encounter for immunization: Secondary | ICD-10-CM | POA: Insufficient documentation

## 2019-04-07 DIAGNOSIS — S80861D Insect bite (nonvenomous), right lower leg, subsequent encounter: Secondary | ICD-10-CM | POA: Diagnosis not present

## 2019-04-07 DIAGNOSIS — Z87891 Personal history of nicotine dependence: Secondary | ICD-10-CM | POA: Insufficient documentation

## 2019-04-07 DIAGNOSIS — Z79899 Other long term (current) drug therapy: Secondary | ICD-10-CM | POA: Diagnosis not present

## 2019-04-07 MED ORDER — ACETAMINOPHEN 325 MG PO TABS
650.0000 mg | ORAL_TABLET | Freq: Once | ORAL | Status: AC
  Administered 2019-04-08: 650 mg via ORAL
  Filled 2019-04-07: qty 2

## 2019-04-07 MED ORDER — CLINDAMYCIN HCL 150 MG PO CAPS
300.0000 mg | ORAL_CAPSULE | Freq: Once | ORAL | Status: AC
  Administered 2019-04-08: 300 mg via ORAL
  Filled 2019-04-07: qty 2

## 2019-04-07 MED ORDER — ONDANSETRON 8 MG PO TBDP
8.0000 mg | ORAL_TABLET | Freq: Once | ORAL | Status: AC
  Administered 2019-04-07: 8 mg via ORAL
  Filled 2019-04-07: qty 1

## 2019-04-07 MED ORDER — TETANUS-DIPHTH-ACELL PERTUSSIS 5-2.5-18.5 LF-MCG/0.5 IM SUSP
0.5000 mL | Freq: Once | INTRAMUSCULAR | Status: AC
Start: 2019-04-07 — End: 2019-04-07
  Administered 2019-04-07: 0.5 mL via INTRAMUSCULAR
  Filled 2019-04-07: qty 0.5

## 2019-04-07 NOTE — ED Triage Notes (Signed)
She was here 2 days ago for an infected mosquito bite to her right lower leg. She received IV antibiotics and was discharged with a Rx for oral antibiotics. She has been vomiting with the oral antibiotic use. Hx of diabetes.

## 2019-04-07 NOTE — Progress Notes (Signed)
Based on what you shared with me it looks like you have a bad infection with nausea and vomiting,that should be evaluated in a face to face office visit.  I reviewed your ER note and the picture of your wound and looks like it may need to be opened and debrided. Especially if it is still draining greenish excudate. I do not see that they did a culture of wound which would help to ensure that proper antibiotics are given. You proably need IV antibiotics as well as something to stop the vomiting.    NOTE: If you entered your credit card information for this eVisit, you will not be charged. You may see a "hold" on your card for the $35 but that hold will drop off and you will not have a charge processed.  If you are having a true medical emergency please call 911.     For an urgent face to face visit, [place] has four urgent care centers for your convenience:   . [facility]    [phone]                  Get Driving Directions  [address], [place] [address] . 10 am to 8 pm Monday-Friday . 12 pm to 8 pm Saturday-Sunday   . [facility] at [place]                  Get Driving Directions  [address], [place], [place] [address] . 8 am to 8 pm Monday-Friday . 9 am to 6 pm Saturday . 11 am to 6 pm Sunday   . [facility] at [place]                  Get Driving Directions   [address], [place] [address] . 8 am to 8 pm Monday-Friday . 8 am to 4 pm Saturday-Sunday    . [facility] at [place]                    Get Driving Directions  [phone]  [address] [place],  [address]  . Monday-Friday, 12 PM to 6 PM    Your e-visit answers were reviewed by a board certified advanced clinical practitioner to complete your personal care plan.  Thank you for using e-Visits.

## 2019-04-08 ENCOUNTER — Encounter (HOSPITAL_BASED_OUTPATIENT_CLINIC_OR_DEPARTMENT_OTHER): Payer: Self-pay | Admitting: Emergency Medicine

## 2019-04-08 MED ORDER — ONDANSETRON 8 MG PO TBDP: ORAL_TABLET | ORAL | 0 refills | Status: DC

## 2019-04-08 NOTE — ED Notes (Signed)
No vomiting noted; patient tolerated medication and po fluids well. Discharged home with instructions to take zofran 15-20 minutes prior to antibiotic dose. Patient verbalized understanding.

## 2019-04-08 NOTE — ED Notes (Signed)
Patient tolerating po fluids; states nausea has improved; gave tylenol per orders and will reassess.

## 2019-04-08 NOTE — ED Provider Notes (Signed)
[facility] EMERGENCY DEPARTMENT Provider Note   CSN: [id number] Arrival date & time: 04/07/19  2200     History   Chief Complaint Chief Complaint  Patient presents with  . Wound Check    HPI Savannah Clark is a 58 y.o. female.     The history is provided by the patient.  Wound Check This is a new problem. The current episode started more than 2 days ago. The problem occurs constantly. The problem has been gradually improving. Pertinent negatives include no chest pain, no abdominal pain, no headaches and no shortness of breath. Nothing aggravates the symptoms. Nothing relieves the symptoms. She has tried nothing for the symptoms. The treatment provided significant relief.  Patient seen for wound on Right shin 2 days ago but has not been able to hold down her clindamycin so she has returned.  No f/c/r. She states wound is improved.    Past Medical History:  Diagnosis Date  . Anemia   . Anxiety   . Asthma   . Bronchitis   . Celiac disease   . Depression   . Night terror disorder   . Pancreatitis, chronic ([place])   . PTSD (post-traumatic stress disorder)   . Renal disorder    mild kidney disease    There are no active problems to display for this patient.   Past Surgical History:  Procedure Laterality Date  . FOOT SURGERY       OB History   No obstetric history on file.      Home Medications    Prior to Admission medications   Medication Sig Start Date End Date Taking? Authorizing Provider  albuterol (PROVENTIL HFA;VENTOLIN HFA) 108 (90 BASE) MCG/ACT inhaler Inhale 2 puffs into the lungs every 6 (six) hours as needed. wheezing     [provider]  b complex vitamins tablet Take 1 tablet by mouth daily.      [provider]  Biotin 5000 MCG CAPS Take by mouth.    [provider]  buPROPion HCl (WELLBUTRIN PO) Take by mouth.    [provider]  busPIRone (BUSPAR) 5 MG tablet Take 15 mg by mouth 2 (two) times daily.      [provider]  CALCIUM PO Take 1 tablet by mouth daily.    [provider]  cetirizine (ZYRTEC) 5 MG chewable tablet Chew 2 tablets (10 mg total) by mouth daily. 09/17/11 09/16/12  Schinlever, Barnetta Chapel, PA-C  clindamycin (CLEOCIN) 300 MG capsule Take 1 capsule (300 mg total) by mouth 4 (four) times daily. X 7 days 04/05/19   Veryl Speak, MD  diphenhydrAMINE (BENADRYL) 25 MG tablet Take 25 mg by mouth every 6 (six) hours as needed.    [provider]  fluticasone (FLONASE) 50 MCG/ACT nasal spray Place 1 spray into both nostrils daily for 5 doses. 07/04/18 07/09/18  Janeece Fitting, PA-C  Fluticasone-Salmeterol (ADVAIR) 250-50 MCG/DOSE AEPB Inhale 1 puff into the lungs 2 (two) times daily.    [provider]  hydrOXYzine (VISTARIL) 50 MG capsule Take 50 mg by mouth 2 (two) times daily.      [provider]  magnesium 30 MG tablet Take 30 mg by mouth 2 (two) times daily.    [provider]  naproxen (NAPROSYN) 500 MG tablet Take 1 tablet (500 mg total) by mouth 2 (two) times daily. 10/28/14   Tanna Furry, MD  pantoprazole (PROTONIX) 40 MG tablet Take 40 mg by mouth daily.    [provider]  prazosin (MINIPRESS) 1 MG capsule Take 1 mg by mouth at bedtime.    [provider]  Prenatal Vit-Fe Fumarate-FA (PRENATAL MULTIVITAMIN) TABS tablet Take 1 tablet by mouth daily at 12 noon.    [provider]  prenatal vitamin w/FE, FA (PRENATAL 1 + 1) 27-1 MG TABS tablet Take 1 tablet by mouth daily at 12 noon.    [provider]  topiramate (TOPAMAX) 50 MG tablet Take 50 mg by mouth 2 (two) times daily.      [provider]  Vortioxetine HBr (BRINTELLIX) 20 MG TABS Take by mouth.    [provider]    Family History No family history on file.  Social History Social History   Tobacco Use  . Smoking status: Former Smoker    Quit date: 06/09/2007    Years since quitting: 11.8  . Smokeless tobacco: Never  Used  Substance Use Topics  . Alcohol use: Yes    Comment: rare  . Drug use: No     Allergies   Adhesive [tape], Amoxicillin-pot clavulanate, Aspirin, Farxiga [dapagliflozin], Gluten meal, Januvia [sitagliptin], Lactose intolerance (gi), and Wheat   Review of Systems Review of Systems  Constitutional: Negative for fever.  HENT: Negative for congestion.   Eyes: Negative for visual disturbance.  Respiratory: Negative for shortness of breath.   Cardiovascular: Negative for chest pain.  Gastrointestinal: Negative for abdominal pain.  Endocrine: Negative for polyuria.  Genitourinary: Negative for difficulty urinating.  Skin: Positive for wound.  Neurological: Negative for weakness and headaches.  Psychiatric/Behavioral: Negative for agitation.  All other systems reviewed and are negative.    Physical Exam Updated Vital Signs BP (!) 114/103 (BP Location: Right Arm)   Pulse 91   Temp 99.3 F (37.4 C) (Oral)   Resp 20   Ht 5\' 7"  (1.702 m)   Wt 77.1 kg   SpO2 98%   BMI 26.63 kg/m   Physical Exam Vitals signs and nursing note reviewed.  Constitutional:      General: She is not in acute distress.    Appearance: She is normal weight.  HENT:     Head: Normocephalic and atraumatic.     Nose: Nose normal.  Eyes:     Extraocular Movements: Extraocular movements intact.     Pupils: Pupils are equal, round, and reactive to light.  Neck:     Musculoskeletal: Normal range of motion and neck supple.  Cardiovascular:     Rate and Rhythm: Normal rate and regular rhythm.     Pulses: Normal pulses.     Heart sounds: Normal heart sounds.  Pulmonary:     Effort: Pulmonary effort is normal.     Breath sounds: Normal breath sounds.  Abdominal:     General: Abdomen is flat. Bowel sounds are normal.     Tenderness: There is no abdominal tenderness.  Musculoskeletal: Normal range of motion.  Skin:    General: Skin is warm and dry.     Capillary Refill: Capillary refill takes less  than 2 seconds.     Comments: See HAIKU image  Neurological:     General: No focal deficit present.     Mental Status: She is alert and oriented to person, place, and time.  Psychiatric:        Mood and Affect: Mood normal.        Behavior: Behavior normal.        ED Treatments / Results  Labs (all labs ordered are listed, but  only abnormal results are displayed) Labs Reviewed - No data to display  EKG None  Radiology Dg Tibia/fibula Right  Result Date: 04/07/2019 CLINICAL DATA:  Infection. EXAM: RIGHT TIBIA AND FIBULA - 2 VIEW COMPARISON:  None. FINDINGS: There are degenerative changes of the right knee that are mild-to-moderate. There is some soft tissue swelling about the lateral aspect of the right lower extremity. There is no subcutaneous gas. There is no displaced fracture. No radiopaque foreign body. IMPRESSION: Mild nonspecific soft tissue swelling involving the right lower extremity. No displaced fracture or radiopaque foreign body. Electronically Signed   By: Katherine Mantle M.D.   On: 04/07/2019 23:49    Procedures Procedures (including critical care time)  Medications Ordered in ED Medications  clindamycin (CLEOCIN) capsule 300 mg (has no administration in time range)  acetaminophen (TYLENOL) tablet 650 mg (has no administration in time range)  ondansetron (ZOFRAN-ODT) disintegrating tablet 8 mg (8 mg Oral Given 04/07/19 2335)  Tdap (BOOSTRIX) injection 0.5 mL (0.5 mLs Intramuscular Given 04/07/19 2336)     Initial Impression / Assessment and Plan / ED Course  Wound looks improved, see pictures.  Patient states wound is improved.  Patient was given nausea medication in the ED and PO challenged and got antibiotics here.  Wound care was provided and instructions.  She is stable for discharge at this to home.  Continue antibiotics and BID wound care at home.   Savannah Clark was evaluated in Emergency Department on 04/08/2019 for the symptoms described in the  history of present illness. She was evaluated in the context of the global COVID-19 pandemic, which necessitated consideration that the patient might be at risk for infection with the SARS-CoV-2 virus that causes COVID-19. Institutional protocols and algorithms that pertain to the evaluation of patients at risk for COVID-19 are in a state of rapid change based on information released by regulatory bodies including the CDC and federal and state organizations. These policies and algorithms were followed during the patient's care in the ED.  Final Clinical Impressions(s) / ED Diagnoses   Return for weakness, numbness, changes in vision or speech, fevers >100.4 unrelieved by medication, shortness of breath, intractable vomiting, or diarrhea, abdominal pain, Inability to tolerate liquids or food, cough, altered mental status or any concerns. No signs of systemic illness or infection. The patient is nontoxic-appearing on exam and vital signs are within normal limits.   I have reviewed the triage vital signs and the nursing notes. Pertinent labs &imaging results that were available during my care of the patient were reviewed by me and considered in my medical decision making (see chart for details).  After history, exam, and medical workup I feel the patient has been appropriately medically screened and is safe for discharge home. Pertinent diagnoses were discussed with the patient. Patient was given return precautions   Idolina Mantell, MD 04/08/19 [phone]

## 2019-07-18 ENCOUNTER — Encounter (INDEPENDENT_AMBULATORY_CARE_PROVIDER_SITE_OTHER): Payer: Self-pay

## 2019-09-01 ENCOUNTER — Ambulatory Visit: Payer: Medicaid Other

## 2020-01-09 ENCOUNTER — Encounter: Payer: Self-pay | Admitting: Physical Therapy

## 2020-01-09 ENCOUNTER — Other Ambulatory Visit: Payer: Self-pay

## 2020-01-09 ENCOUNTER — Ambulatory Visit: Payer: Medicaid Other | Attending: Nurse Practitioner | Admitting: Physical Therapy

## 2020-01-09 DIAGNOSIS — G8929 Other chronic pain: Secondary | ICD-10-CM | POA: Insufficient documentation

## 2020-01-09 DIAGNOSIS — M545 Low back pain, unspecified: Secondary | ICD-10-CM

## 2020-01-09 DIAGNOSIS — M6281 Muscle weakness (generalized): Secondary | ICD-10-CM | POA: Insufficient documentation

## 2020-01-09 DIAGNOSIS — R293 Abnormal posture: Secondary | ICD-10-CM | POA: Diagnosis present

## 2020-01-09 NOTE — Patient Instructions (Signed)
Savannah Clark, modified 2 x day 3 x 30 sec  Supine post pelvic tilt 2 x 10,  10 sec x 2 per day  Knee to chest 30 sec x 3 x 2 per day   [phone] Savannah Clark, PT
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
## 2020-01-16 ENCOUNTER — Other Ambulatory Visit: Payer: Self-pay

## 2020-01-16 ENCOUNTER — Ambulatory Visit: Payer: Medicaid Other

## 2020-01-16 DIAGNOSIS — R293 Abnormal posture: Secondary | ICD-10-CM

## 2020-01-16 DIAGNOSIS — M6281 Muscle weakness (generalized): Secondary | ICD-10-CM

## 2020-01-16 DIAGNOSIS — G8929 Other chronic pain: Secondary | ICD-10-CM

## 2020-01-16 DIAGNOSIS — M545 Low back pain: Secondary | ICD-10-CM | POA: Diagnosis not present

## 2020-01-17 NOTE — Therapy (Signed)
[facility] Outpatient Rehabilitation [facility] [address] [place], [place], [address] Phone: [phone]   Fax:  [phone]  Physical Therapy Treatment  Patient Details  Name: Savannah Clark MRN: [id number] Date of Birth: 13-Nov-1960 Referring Provider (PT): Dr. Courtney Paris [facility])    Encounter Date: 01/16/2020   PT End of Session - 01/17/20 0944    Visit Number 2    Number of Visits 16    Date for PT Re-Evaluation 03/05/20    Authorization Type MCD    Authorization - Visit Number 1    Authorization - Number of Visits 3    PT Start Time 1006    PT Stop Time 1100    PT Time Calculation (min) 54 min    Activity Tolerance Patient tolerated treatment well    Behavior During Therapy [facility] for tasks assessed/performed           Past Medical History:  Diagnosis Date  . Anemia   . Anxiety   . Asthma   . Bronchitis   . Celiac disease   . Depression   . Night terror disorder   . Pancreatitis, chronic (HCC)   . PTSD (post-traumatic stress disorder)   . Renal disorder    mild kidney disease    Past Surgical History:  Procedure Laterality Date  . FOOT SURGERY      There were no vitals filed for this visit.   Subjective Assessment - 01/16/20 1010    Subjective Pt reports 1 to 2 days after her PT eval she had injections of a steroid and tramadol which made her feel wrose to the point she almost went to the ER.    Currently in Pain? Yes    Pain Score 6     Pain Location Back    Pain Descriptors / Indicators Dull    Pain Type Chronic pain    Pain Radiating Towards middle back and hips    Pain Onset More than a month ago    Pain Frequency Intermittent    Aggravating Factors  Sitting, bending    Pain Relieving Factors medicine, prayer, laying down    Effect of Pain on Daily Activities limited sitting                             [facility] Adult PT Treatment/Exercise - 01/17/20 0001      Self-Care   Self-Care Other Self-Care  Comments    Other Self-Care Comments  HEP: See Education      Exercises   Exercises Lumbar;Knee/Hip      Lumbar Exercises: Stretches   Single Knee to Chest Stretch Right;Left;2 reps;20 seconds    Hip Flexor Stretch Right;Left;2 reps    Hip Flexor Stretch Limitations Sitting at edge of mat table    Piriformis Stretch Right;Left;2 reps;20 seconds    Figure 4 Stretch 2 reps;20 seconds      Lumbar Exercises: Standing   Scapular Retraction Strengthening;Both;10 reps    Theraband Level (Scapular Retraction) Level 3 (Green)    Scapular Retraction Limitations 2 sets    Shoulder Extension Strengthening;10 reps    Theraband Level (Shoulder Extension) Level 3 (Green)    Shoulder Extension Limitations 2 sets      Modalities   Modalities Moist Heat      Moist Heat Therapy   Number Minutes Moist Heat 15 Minutes    Moist Heat Location Lumbar Spine   thoracic spine  PT Education - 01/17/20 0943    Education Details HEP    Person(s) Educated Patient    Methods Explanation;Demonstration;Tactile cues;Verbal cues;Handout    Comprehension Verbalized understanding;Returned demonstration;Verbal cues required;Tactile cues required;Need further instruction            PT Short Term Goals - 01/09/20 1504      PT SHORT TERM GOAL #1   Title Pt will be I with initial HEP for hip and trunk flexibility, strength in core    Baseline given on eval    Time 2    Period Weeks    Status New    Target Date 01/26/20      PT SHORT TERM GOAL #2   Title Pt will understand posture, lifting principles for ADLs and IADLs.    Baseline unknown    Time 4    Period Weeks    Status New    Target Date 02/06/20      PT SHORT TERM GOAL #3   Title Pt wil be able to report less strain in mid/low back with performing basic home tasks.    Baseline pain and spasms with bending and moving certain ways    Time 4    Period Weeks    Status New    Target Date 02/06/20             PT  Long Term Goals - 01/09/20 1507      PT LONG TERM GOAL #1   Title Pt will be able to sit comfortably for meals without needing to repositon as frequently    Baseline leans Rt/Lt frequently with sitting even short periods    Time 8    Period Weeks    Status New    Target Date 03/05/20      PT LONG TERM GOAL #2   Title Pt will be able to show I for more advanced HEP upon discharge    Baseline unknown    Time 8    Period Weeks    Status New    Target Date 03/05/20      PT LONG TERM GOAL #3   Title Pt will be able to return to walking 30 min or more 3 days per week for fitness.    Baseline unable to do this due to pain    Time 8    Period Weeks    Status New    Target Date 03/05/20      PT LONG TERM GOAL #4   Title Pt will be able to transition sit to stand, supine without increased pain    Baseline increased effort, pain in back and hips    Time 8    Period Weeks    Status New    Target Date 03/05/20      PT LONG TERM GOAL #5   Title Pt will demo pain free trunk, lumbar AROM to show greater mobility.    Baseline pain with flexion, sidebending bilateral and rotation    Time 8    Period Weeks    Status New    Target Date 03/05/20                 Plan - 01/17/20 0947    Clinical Impression Statement PT focused on flexibility of the hip and low back and core/back strengthening. MH was provided for pain management.which the pt reported being helpful. Pt returned demonstratio of the ther ex and HEP. Pr will benfit from PT to address  flexibility, strength, and pain to improve functional mobility.    Personal Factors and Comorbidities Comorbidity 1;Comorbidity 3+;Social Background;Time since onset of injury/illness/exacerbation    Comorbidities diabetes, pancreatitis, anxiety    Examination-Activity Limitations Caring for Others;Bed Mobility;Reach Overhead;Sit;Stand;Lift;Sleep;Squat;Locomotion Level;Carry;Transfers    Examination-Participation Restrictions  Church;Cleaning;Community Activity;Shop    Stability/Clinical Decision Making Evolving/Moderate complexity    Clinical Decision Making Moderate    Rehab Potential Excellent    PT Frequency 2x / week    PT Duration 8 weeks    PT Treatment/Interventions ADLs/Self Care Home Management;Electrical Stimulation;Cryotherapy;Moist Heat;Traction;Iontophoresis 4mg /ml Dexamethasone;Functional mobility training;Therapeutic exercise;Neuromuscular re-education;Patient/family education;Dry needling;Manual techniques;Passive range of motion    PT Next Visit Plan Assess response to ther ex/HEP initiated today    PT Home Exercise Plan [id number]: PPT; PPT c marching, SKTC, piriformis stretch, figure 4 stretch into ER; Tbnad for scapular retraction and shoulder ext.    Consulted and Agree with Plan of Care Patient           Patient will benefit from skilled therapeutic intervention in order to improve the following deficits and impairments:  Decreased activity tolerance, Decreased range of motion, Decreased strength, Hypomobility, Decreased mobility, Difficulty walking, Postural dysfunction, Pain, Improper body mechanics, Increased fascial restricitons, Impaired flexibility  Visit Diagnosis: Chronic midline low back pain without sciatica  Abnormal posture  Muscle weakness (generalized)     Problem List There are no problems to display for this patient.   MS, PT 01/17/20 10:00 AM  [facility] [address] [place], [place], [place] Phone: [phone]   Fax:  [phone]  Name: Savannah Clark MRN: Dareen Piano Date of Birth: 25-May-1961

## 2020-01-22 ENCOUNTER — Ambulatory Visit: Payer: Medicaid Other | Admitting: Physical Therapy

## 2020-01-22 ENCOUNTER — Other Ambulatory Visit: Payer: Self-pay

## 2020-01-22 DIAGNOSIS — M6281 Muscle weakness (generalized): Secondary | ICD-10-CM

## 2020-01-22 DIAGNOSIS — G8929 Other chronic pain: Secondary | ICD-10-CM

## 2020-01-22 DIAGNOSIS — R293 Abnormal posture: Secondary | ICD-10-CM

## 2020-01-22 DIAGNOSIS — M545 Low back pain: Secondary | ICD-10-CM | POA: Diagnosis not present

## 2020-01-22 NOTE — Therapy (Signed)
[facility] Outpatient Rehabilitation [facility] [address] [place], [place], [address] Phone: [phone]   Fax:  [phone]  Physical Therapy Treatment  Patient Details  Name: Savannah Clark MRN: [id number] Date of Birth: 08-30-1960 Referring Provider (PT): Dr. Courtney Paris [facility])    Encounter Date: 01/22/2020   PT End of Session - 01/22/20 0928    Visit Number 3    Number of Visits 16    Date for PT Re-Evaluation 03/05/20    Authorization Type MCD    Authorization Time Period 8/6 8/19    Authorization - Visit Number 2    Authorization - Number of Visits 3    PT Start Time 0920    PT Stop Time 1015    PT Time Calculation (min) 55 min    Activity Tolerance Patient tolerated treatment well    Behavior During Therapy [facility] for tasks assessed/performed           Past Medical History:  Diagnosis Date  . Anemia   . Anxiety   . Asthma   . Bronchitis   . Celiac disease   . Depression   . Night terror disorder   . Pancreatitis, chronic (HCC)   . PTSD (post-traumatic stress disorder)   . Renal disorder    mild kidney disease    Past Surgical History:  Procedure Laterality Date  . FOOT SURGERY      There were no vitals filed for this visit.   Subjective Assessment - 01/22/20 0924    Subjective Pt reiterated symptoms after the steriod.  Did not do the exercises much because of pain and also didn't get the band .    Currently in Pain? Yes    Pain Score 5     Pain Location Back    Pain Orientation Right;Left;Lower    Pain Descriptors / Indicators Aching    Pain Type Chronic pain    Pain Radiating Towards to mid back    Pain Onset More than a month ago    Pain Frequency Intermittent    Aggravating Factors  sitting worse than standing    Pain Relieving Factors laying down                          [facility] Adult PT Treatment/Exercise - 01/22/20 0001      Lumbar Exercises: Stretches   Single Knee to Chest Stretch Right;Left;2  reps;20 seconds    Single Knee to Chest Stretch Limitations add rotation to trunk     Pelvic Tilt 10 reps    Piriformis Stretch Right;Left;2 reps;20 seconds      Lumbar Exercises: Supine   Bridge 10 reps    Bridge Limitations partial range       Lumbar Exercises: Sidelying   Other Sidelying Lumbar Exercises prolonged Ql stretch for L side with pillow under waist       Lumbar Exercises: Quadruped   Madcat/Old Horse 10 reps    Madcat/Old Horse Limitations cues     Single Arm Raise 5 reps    Straight Leg Raise 5 reps    Opposite Arm/Leg Raise Right arm/Left leg;Left arm/Right leg;5 reps    Opposite Arm/Leg Raise Limitations pain       Modalities   Modalities Moist Heat      Moist Heat Therapy   Number Minutes Moist Heat 10 Minutes    Moist Heat Location Lumbar Spine      Manual Therapy   Manual Therapy  Soft tissue mobilization;Myofascial release    Manual therapy comments TrP work to L quadratus lumborum, compression to piriformis, glutes and Lumbar paraspinals     Soft tissue mobilization lumbosacral region    Myofascial Release lumbar in sidelying                     PT Short Term Goals - 01/09/20 1504      PT SHORT TERM GOAL #1   Title Pt will be I with initial HEP for hip and trunk flexibility, strength in core    Baseline given on eval    Time 2    Period Weeks    Status New    Target Date 01/26/20      PT SHORT TERM GOAL #2   Title Pt will understand posture, lifting principles for ADLs and IADLs.    Baseline unknown    Time 4    Period Weeks    Status New    Target Date 02/06/20      PT SHORT TERM GOAL #3   Title Pt wil be able to report less strain in mid/low back with performing basic home tasks.    Baseline pain and spasms with bending and moving certain ways    Time 4    Period Weeks    Status New    Target Date 02/06/20             PT Long Term Goals - 01/09/20 1507      PT LONG TERM GOAL #1   Title Pt will be able to sit  comfortably for meals without needing to repositon as frequently    Baseline leans Rt/Lt frequently with sitting even short periods    Time 8    Period Weeks    Status New    Target Date 03/05/20      PT LONG TERM GOAL #2   Title Pt will be able to show I for more advanced HEP upon discharge    Baseline unknown    Time 8    Period Weeks    Status New    Target Date 03/05/20      PT LONG TERM GOAL #3   Title Pt will be able to return to walking 30 min or more 3 days per week for fitness.    Baseline unable to do this due to pain    Time 8    Period Weeks    Status New    Target Date 03/05/20      PT LONG TERM GOAL #4   Title Pt will be able to transition sit to stand, supine without increased pain    Baseline increased effort, pain in back and hips    Time 8    Period Weeks    Status New    Target Date 03/05/20      PT LONG TERM GOAL #5   Title Pt will demo pain free trunk, lumbar AROM to show greater mobility.    Baseline pain with flexion, sidebending bilateral and rotation    Time 8    Period Weeks    Status New    Target Date 03/05/20                 Plan - 01/22/20 6160    Clinical Impression Statement Pt with increased pain and Trigger point in L quadratus lumborum.  Addressed spinal mobility and activity tolerance, limited due to pain . Reinforced stretching OK despite pain,  consistency. Less pain as she left clinic , felt relaxed and able to walk with ease.    Examination-Activity Limitations Caring for Others;Bed Mobility;Reach Overhead;Sit;Stand;Lift;Sleep;Squat;Locomotion Level;Carry;Transfers    PT Treatment/Interventions ADLs/Self Care Home Management;Electrical Stimulation;Cryotherapy;Moist Heat;Traction;Iontophoresis 4mg /ml Dexamethasone;Functional mobility training;Therapeutic exercise;Neuromuscular re-education;Patient/family education;Dry needling;Manual techniques;Passive range of motion    PT Next Visit Plan manual to L QL, cont trunk stretching  and light core    PT Home Exercise Plan [id number]: PPT; PPT c marching, SKTC, piriformis stretch, figure 4 stretch into ER; Red T band for scapular retraction and shoulder ext., QL and cat cow    Consulted and Agree with Plan of Care Patient           Patient will benefit from skilled therapeutic intervention in order to improve the following deficits and impairments:  Decreased activity tolerance, Decreased range of motion, Decreased strength, Hypomobility, Decreased mobility, Difficulty walking, Postural dysfunction, Pain, Improper body mechanics, Increased fascial restricitons, Impaired flexibility  Visit Diagnosis: Chronic midline low back pain without sciatica  Abnormal posture  Muscle weakness (generalized)     Problem List There are no problems to display for this patient.   Caedin Mogan 01/22/2020, 10:25 AM  [facility] [address] [place], [place], [address] Phone: [phone]   Fax:  [phone]  Name: Savannah Clark MRN: [id number] Date of Birth: 08-Sep-1960  Karie Mainland, PT 01/22/20 10:25 AM Phone: [phone] Fax: [phone]

## 2020-01-24 ENCOUNTER — Encounter: Payer: Self-pay | Admitting: Physical Therapy

## 2020-01-24 ENCOUNTER — Ambulatory Visit: Payer: Medicaid Other | Admitting: Physical Therapy

## 2020-01-24 ENCOUNTER — Other Ambulatory Visit: Payer: Self-pay

## 2020-01-24 DIAGNOSIS — M545 Low back pain, unspecified: Secondary | ICD-10-CM

## 2020-01-24 DIAGNOSIS — R293 Abnormal posture: Secondary | ICD-10-CM

## 2020-01-24 DIAGNOSIS — M6281 Muscle weakness (generalized): Secondary | ICD-10-CM

## 2020-01-24 DIAGNOSIS — G8929 Other chronic pain: Secondary | ICD-10-CM

## 2020-01-24 NOTE — Therapy (Signed)
[facility] Outpatient Rehabilitation [facility] [address] [place], [place], [address] Phone: [phone]   Fax:  [phone]  Physical Therapy Treatment  Patient Details  Name: Savannah Clark MRN: [id number] Date of Birth: 04/24/1961 Referring Provider (PT): Dr. Courtney Paris [facility] Medical)    Encounter Date: 01/24/2020   PT End of Session - 01/24/20 1420    Visit Number 4    Number of Visits 16    Date for PT Re-Evaluation 03/05/20    Authorization Type MCD    Authorization Time Period 8/6 8/19    Authorization - Visit Number 3    Authorization - Number of Visits 3    PT Start Time 0917    PT Stop Time 1000    PT Time Calculation (min) 43 min    Activity Tolerance Patient tolerated treatment well    Behavior During Therapy [facility] for tasks assessed/performed           Past Medical History:  Diagnosis Date  . Anemia   . Anxiety   . Asthma   . Bronchitis   . Celiac disease   . Depression   . Night terror disorder   . Pancreatitis, chronic (HCC)   . PTSD (post-traumatic stress disorder)   . Renal disorder    mild kidney disease    Past Surgical History:  Procedure Laterality Date  . FOOT SURGERY      There were no vitals filed for this visit.   Subjective Assessment - 01/24/20 0920    Subjective MId back sore, maybe because of the bands.    Currently in Pain? Yes    Pain Score 6     Pain Location Back    Pain Orientation Mid;Lower    Pain Descriptors / Indicators Sore    Pain Type Chronic pain    Pain Onset More than a month ago    Pain Frequency Intermittent               [facility] Adult PT Treatment/Exercise - 01/24/20 0001      Lumbar Exercises: Stretches   Double Knee to Chest Stretch Limitations x 1, 60 sec     Lower Trunk Rotation Limitations x 5, 15 sec added head turns and arms open , then book openings x 5 each side       Lumbar Exercises: Aerobic   Nustep 6 min UE and LE    L 6      Lumbar Exercises: Supine   Clam  Limitations green bilateral x 15     Dead Bug Limitations x 8     Bridge 10 reps    Bridge Limitations partial range       Lumbar Exercises: Sidelying   Clam 15 reps    Other Sidelying Lumbar Exercises sidelying quadratus stretch with light manual       Lumbar Exercises: Quadruped   Madcat/Old Horse 10 reps    Other Quadruped Lumbar Exercises childs pose x 3 added lateral plane       Knee/Hip Exercises: Sidelying   Hip ABduction --    Hip ABduction Limitations --      Manual Therapy   Manual therapy comments light friction with breathi work to 12t th rib attachment     Myofascial Release lumbar in sidelying                     PT Short Term Goals - 01/09/20 1504      PT SHORT TERM GOAL #  1   Title Pt will be I with initial HEP for hip and trunk flexibility, strength in core    Baseline given on eval    Time 2    Period Weeks    Status New    Target Date 01/26/20      PT SHORT TERM GOAL #2   Title Pt will understand posture, lifting principles for ADLs and IADLs.    Baseline unknown    Time 4    Period Weeks    Status New    Target Date 02/06/20      PT SHORT TERM GOAL #3   Title Pt wil be able to report less strain in mid/low back with performing basic home tasks.    Baseline pain and spasms with bending and moving certain ways    Time 4    Period Weeks    Status New    Target Date 02/06/20             PT Long Term Goals - 01/09/20 1507      PT LONG TERM GOAL #1   Title Pt will be able to sit comfortably for meals without needing to repositon as frequently    Baseline leans Rt/Lt frequently with sitting even short periods    Time 8    Period Weeks    Status New    Target Date 03/05/20      PT LONG TERM GOAL #2   Title Pt will be able to show I for more advanced HEP upon discharge    Baseline unknown    Time 8    Period Weeks    Status New    Target Date 03/05/20      PT LONG TERM GOAL #3   Title Pt will be able to return to walking 30  min or more 3 days per week for fitness.    Baseline unable to do this due to pain    Time 8    Period Weeks    Status New    Target Date 03/05/20      PT LONG TERM GOAL #4   Title Pt will be able to transition sit to stand, supine without increased pain    Baseline increased effort, pain in back and hips    Time 8    Period Weeks    Status New    Target Date 03/05/20      PT LONG TERM GOAL #5   Title Pt will demo pain free trunk, lumbar AROM to show greater mobility.    Baseline pain with flexion, sidebending bilateral and rotation    Time 8    Period Weeks    Status New    Target Date 03/05/20                 Plan - 01/24/20 0936    Clinical Impression Statement Patient with less low back pain but withmid scapular discomfort , burning with exercises.  Continued to empasize mobility, flexibility and introduce some lateral hips strengthening.  Improved tolerance noted today.    PT Treatment/Interventions ADLs/Self Care Home Management;Electrical Stimulation;Cryotherapy;Moist Heat;Traction;Iontophoresis 4mg /ml Dexamethasone;Functional mobility training;Therapeutic exercise;Neuromuscular re-education;Patient/family education;Dry needling;Manual techniques;Passive range of motion    PT Next Visit Plan ERO. Re auth MCd visits, manual to L QL, cont trunk stretching and light core    PT Home Exercise Plan [id number]: PPT; PPT c marching, SKTC, piriformis stretch, figure 4 stretch into ER; Red T band for scapular retraction and shoulder  ext., QL and cat cow           Patient will benefit from skilled therapeutic intervention in order to improve the following deficits and impairments:  Decreased activity tolerance, Decreased range of motion, Decreased strength, Hypomobility, Decreased mobility, Difficulty walking, Postural dysfunction, Pain, Improper body mechanics, Increased fascial restricitons, Impaired flexibility  Visit Diagnosis: Abnormal posture  Muscle weakness  (generalized)  Chronic midline low back pain without sciatica     Problem List There are no problems to display for this patient.   Artavius Stearns 01/24/2020, 2:28 PM  [facility] [address] [place], [place], [address] Phone: [phone]   Fax:  [phone]  Name: Savannah Clark MRN: [id number] Date of Birth: 05-24-1961  Karie Mainland, PT 01/24/20 2:28 PM Phone: [phone] Fax: [phone]

## 2020-01-29 ENCOUNTER — Encounter: Payer: Self-pay | Admitting: Physical Therapy

## 2020-01-29 ENCOUNTER — Ambulatory Visit: Payer: Medicaid Other | Admitting: Physical Therapy

## 2020-01-29 ENCOUNTER — Other Ambulatory Visit: Payer: Self-pay

## 2020-01-29 DIAGNOSIS — M545 Low back pain, unspecified: Secondary | ICD-10-CM

## 2020-01-29 DIAGNOSIS — M6281 Muscle weakness (generalized): Secondary | ICD-10-CM

## 2020-01-29 DIAGNOSIS — R293 Abnormal posture: Secondary | ICD-10-CM

## 2020-01-29 NOTE — Therapy (Addendum)
[facility] [address] [place], [place], [address] Phone: [phone]   Fax:  [phone]  Physical Therapy Treatment/Renewal/Discharge   Patient Details  Name: Savannah Clark MRN: [id number] Date of Birth: 1961-01-25 Referring Provider (PT): Dr. Courtney Paris [facility])    Encounter Date: 01/29/2020   PT End of Session - 01/29/20 0922    Visit Number 5    Number of Visits 16    Date for PT Re-Evaluation 03/11/20    Authorization Type MCD    Authorization Time Period 8/6 8/19    PT Start Time 0920    PT Stop Time 1000    PT Time Calculation (min) 40 min    Activity Tolerance Patient tolerated treatment well    Behavior During Therapy [facility] for tasks assessed/performed           Past Medical History:  Diagnosis Date  . Anemia   . Anxiety   . Asthma   . Bronchitis   . Celiac disease   . Depression   . Night terror disorder   . Pancreatitis, chronic (HCC)   . PTSD (post-traumatic stress disorder)   . Renal disorder    mild kidney disease    Past Surgical History:  Procedure Laterality Date  . FOOT SURGERY      There were no vitals filed for this visit.   Subjective Assessment - 01/29/20 0920    Subjective My back is so sore today.  I was leaping around church yesterday (was dancing and singing).  I feel like I have more range and can do things easier.    Currently in Pain? Yes    Pain Score 7     Pain Location Back    Pain Orientation Mid;Lower    Pain Descriptors / Indicators Sore    Pain Type Chronic pain    Pain Onset More than a month ago    Aggravating Factors  sitting and standing too long    Pain Relieving Factors laying down, rest              [facility] PT Assessment - 01/29/20 0001      Strength   Right Hip Flexion 4/5    Right Hip ABduction 4/5    Left Hip Flexion 4/5   pain , spasm in hip   Left Hip ABduction 4/5    Right Knee Flexion 5/5    Right Knee Extension 5/5    Left Knee  Flexion 5/5    Left Knee Extension 5/5             [facility] Adult PT Treatment/Exercise - 01/29/20 0001      Lumbar Exercises: Stretches   Single Knee to Chest Stretch 3 reps;30 seconds    Lower Trunk Rotation Limitations x 5, 15 sec added head turns and arms open , then book openings x 5 each side     Pelvic Tilt 10 reps    Piriformis Stretch 3 reps;30 seconds    Figure 4 Stretch 4 reps;30 seconds      Lumbar Exercises: Aerobic   Tread Mill 10 min, 3.1 mph       Lumbar Exercises: Sidelying   Hip Abduction Both;10 reps      Lumbar Exercises: Quadruped   Other Quadruped Lumbar Exercises childs pose, rocking after TM to release paraspinals x 3                   PT Education - 01/29/20 1001  Education Details progress, HEP, re-auth, exercise/stretch rationale    Person(s) Educated Patient    Methods Explanation;Demonstration;Verbal cues;Handout    Comprehension Verbalized understanding;Returned demonstration            PT Short Term Goals - 01/29/20 0937      PT SHORT TERM GOAL #1   Title Pt will be I with initial HEP for hip and trunk flexibility, strength in core    Status Achieved      PT SHORT TERM GOAL #2   Title Pt will understand posture, lifting principles for ADLs and IADLs.    Status On-going             PT Long Term Goals - 01/29/20 4166      PT LONG TERM GOAL #1   Title Pt will be able to sit comfortably for meals without needing to repositon as frequently    Baseline improved but still needs to reposition for comfort    Status On-going    Target Date 03/11/20      PT LONG TERM GOAL #2   Title Pt will be able to show I for more advanced HEP upon discharge    Baseline working towards this.  Knows basic HEP    Time 6    Period Weeks    Status On-going    Target Date 03/11/20      PT LONG TERM GOAL #3   Title Pt will be able to return to walking 30 min or more 3 days per week for fitness.    Baseline has not done due to heat, thinks  she can walk 15 min    Status On-going    Target Date 03/11/20      PT LONG TERM GOAL #4   Title Pt will be able to transition sit to stand, supine without increased pain    Baseline needs cues    Status On-going    Target Date 03/11/20      PT LONG TERM GOAL #5   Title Pt will demo pain free trunk, lumbar AROM to show greater mobility.    Baseline pain with flexion, sidebending bilateral and rotation    Status On-going    Target Date 03/11/20                 Plan - 01/29/20 0925    Clinical Impression Statement Patietn reported "nothing hurts" as she got onto the treadmill folowing mat exercises. She has improved her tolerance for activity and does show increased strength in legs.  She is working towards goals and will benefit from skilled PT services to improve mobility and activity tolerance, reduce back and hip pain .    PT Frequency 2x / week    PT Duration 6 weeks    PT Treatment/Interventions ADLs/Self Care Home Management;Electrical Stimulation;Cryotherapy;Moist Heat;Traction;Iontophoresis 4mg /ml Dexamethasone;Functional mobility training;Therapeutic exercise;Neuromuscular re-education;Patient/family education;Dry needling;Manual techniques;Passive range of motion    PT Next Visit Plan manual to L QL, cont trunk and stretching, treadmill,  light core.    PT Home Exercise Plan [id number]: PPT; PPT c marching, SKTC, piriformis stretch, greenband for clams and march  figure 4 stretch into ER; Red T band for scapular retraction and shoulder ext., QL and cat cow    Consulted and Agree with Plan of Care Patient           Patient will benefit from skilled therapeutic intervention in order to improve the following deficits and impairments:  Decreased activity tolerance, Decreased range of  motion, Decreased strength, Hypomobility, Decreased mobility, Difficulty walking, Postural dysfunction, Pain, Improper body mechanics, Increased fascial restricitons, Impaired  flexibility  Visit Diagnosis: Abnormal posture  Muscle weakness (generalized)  Chronic midline low back pain without sciatica     Problem List There are no problems to display for this patient.   Savannah Clark 01/29/2020, 10:01 AM  [facility] [address] [place], [place], [address] Phone: [phone]   Fax:  [phone]  Name: Savannah Clark MRN: [id number] Date of Birth: May 20, 1961  Karie Mainland, PT 01/29/20 10:01 AM Phone: [phone] Fax: [phone]   Patient cancelled all appts due to increased pain , requested Discharge from PT> Karie Mainland, PT 03/26/20 9:52 AM Phone: [phone] Fax: [phone]

## 2020-01-31 ENCOUNTER — Ambulatory Visit: Payer: Medicaid Other | Admitting: Physical Therapy

## 2020-02-05 ENCOUNTER — Ambulatory Visit: Payer: Medicaid Other | Admitting: Physical Therapy

## 2020-02-07 ENCOUNTER — Ambulatory Visit: Payer: Medicaid Other | Admitting: Physical Therapy

## 2020-02-21 ENCOUNTER — Ambulatory Visit: Payer: Medicaid Other | Admitting: Physical Therapy

## 2020-03-14 ENCOUNTER — Telehealth: Payer: Self-pay | Admitting: Hematology

## 2020-03-14 NOTE — Telephone Encounter (Signed)
Received a new hem referral from [facility] for decreased wbc. Ms. Greenhouse has been cld and scheduled to see Dr. Candise Che on 10/21 at 11am.

## 2020-03-28 ENCOUNTER — Telehealth: Payer: Self-pay

## 2020-03-28 ENCOUNTER — Inpatient Hospital Stay: Payer: Medicaid Other | Admitting: Hematology

## 2020-03-28 ENCOUNTER — Telehealth: Payer: Self-pay | Admitting: Hematology

## 2020-03-28 NOTE — Telephone Encounter (Signed)
Ms. Llorens has been rescheduled to see Dr. Candise Che on 11/4 at 11am

## 2020-03-28 NOTE — Telephone Encounter (Signed)
Patient called to reschedule appt not feeling well scheduling appt sent

## 2020-04-01 ENCOUNTER — Ambulatory Visit (INDEPENDENT_AMBULATORY_CARE_PROVIDER_SITE_OTHER): Payer: Medicaid Other | Admitting: Psychiatry

## 2020-04-01 ENCOUNTER — Other Ambulatory Visit: Payer: Self-pay

## 2020-04-01 ENCOUNTER — Encounter (HOSPITAL_COMMUNITY): Payer: Self-pay | Admitting: Psychiatry

## 2020-04-01 VITALS — BP 121/90 | HR 79 | Ht 67.0 in | Wt 129.0 lb

## 2020-04-01 DIAGNOSIS — F4310 Post-traumatic stress disorder, unspecified: Secondary | ICD-10-CM | POA: Diagnosis not present

## 2020-04-01 DIAGNOSIS — F330 Major depressive disorder, recurrent, mild: Secondary | ICD-10-CM

## 2020-04-01 DIAGNOSIS — F411 Generalized anxiety disorder: Secondary | ICD-10-CM | POA: Diagnosis not present

## 2020-04-01 MED ORDER — BUPROPION HCL ER (XL) 300 MG PO TB24: 300.0000 mg | ORAL_TABLET | ORAL | 2 refills | Status: DC

## 2020-04-01 MED ORDER — HYDROXYZINE PAMOATE 50 MG PO CAPS: 50.0000 mg | ORAL_CAPSULE | Freq: Two times a day (BID) | ORAL | 2 refills | Status: DC

## 2020-04-01 MED ORDER — FLUOXETINE HCL 10 MG PO CAPS: 10.0000 mg | ORAL_CAPSULE | Freq: Every day | ORAL | 2 refills | Status: DC
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
## 2020-04-08 ENCOUNTER — Other Ambulatory Visit (HOSPITAL_COMMUNITY): Payer: Self-pay | Admitting: Psychiatry

## 2020-04-08 ENCOUNTER — Telehealth (HOSPITAL_COMMUNITY): Payer: Self-pay | Admitting: *Deleted

## 2020-04-08 MED ORDER — HYDROXYZINE HCL 50 MG PO TABS: 50.0000 mg | ORAL_TABLET | Freq: Two times a day (BID) | ORAL | 2 refills | Status: DC

## 2020-04-08 NOTE — Telephone Encounter (Signed)
Call from patient stating Ms Savannah Keel NP called in the wrong form of Hydroxyzine, she should be on HCL, but pamoate was called in. Will ask provider to made change if appropriate.

## 2020-04-08 NOTE — Telephone Encounter (Signed)
Medications reordered and sent to preferred pharmacy.  

## 2020-04-11 ENCOUNTER — Other Ambulatory Visit: Payer: Self-pay

## 2020-04-11 ENCOUNTER — Inpatient Hospital Stay: Payer: Medicaid Other | Attending: Hematology | Admitting: Hematology

## 2020-04-11 ENCOUNTER — Inpatient Hospital Stay: Payer: Medicaid Other

## 2020-04-11 VITALS — BP 136/90 | HR 78 | Temp 97.8°F | Resp 18 | Ht 67.0 in | Wt 131.4 lb

## 2020-04-11 DIAGNOSIS — L309 Dermatitis, unspecified: Secondary | ICD-10-CM | POA: Diagnosis not present

## 2020-04-11 DIAGNOSIS — Z79899 Other long term (current) drug therapy: Secondary | ICD-10-CM | POA: Diagnosis not present

## 2020-04-11 DIAGNOSIS — D709 Neutropenia, unspecified: Secondary | ICD-10-CM

## 2020-04-11 DIAGNOSIS — Z87891 Personal history of nicotine dependence: Secondary | ICD-10-CM | POA: Diagnosis not present

## 2020-04-11 DIAGNOSIS — F411 Generalized anxiety disorder: Secondary | ICD-10-CM

## 2020-04-11 DIAGNOSIS — E119 Type 2 diabetes mellitus without complications: Secondary | ICD-10-CM | POA: Insufficient documentation

## 2020-04-11 DIAGNOSIS — R634 Abnormal weight loss: Secondary | ICD-10-CM | POA: Diagnosis not present

## 2020-04-11 DIAGNOSIS — D72819 Decreased white blood cell count, unspecified: Secondary | ICD-10-CM | POA: Insufficient documentation

## 2020-04-11 DIAGNOSIS — R221 Localized swelling, mass and lump, neck: Secondary | ICD-10-CM | POA: Diagnosis not present

## 2020-04-11 DIAGNOSIS — D649 Anemia, unspecified: Secondary | ICD-10-CM | POA: Insufficient documentation

## 2020-04-11 DIAGNOSIS — N3281 Overactive bladder: Secondary | ICD-10-CM | POA: Insufficient documentation

## 2020-04-11 DIAGNOSIS — F329 Major depressive disorder, single episode, unspecified: Secondary | ICD-10-CM | POA: Insufficient documentation

## 2020-04-11 DIAGNOSIS — Z888 Allergy status to other drugs, medicaments and biological substances status: Secondary | ICD-10-CM | POA: Diagnosis not present

## 2020-04-11 DIAGNOSIS — E559 Vitamin D deficiency, unspecified: Secondary | ICD-10-CM | POA: Insufficient documentation

## 2020-04-11 DIAGNOSIS — Z886 Allergy status to analgesic agent status: Secondary | ICD-10-CM | POA: Insufficient documentation

## 2020-04-11 DIAGNOSIS — K859 Acute pancreatitis without necrosis or infection, unspecified: Secondary | ICD-10-CM | POA: Diagnosis not present

## 2020-04-11 DIAGNOSIS — Z880 Allergy status to penicillin: Secondary | ICD-10-CM | POA: Insufficient documentation

## 2020-04-11 LAB — CBC WITH DIFFERENTIAL/PLATELET
Abs Immature Granulocytes: 0.01 10*3/uL (ref 0.00–0.07)
Basophils Absolute: 0 10*3/uL (ref 0.0–0.1)
Basophils Relative: 1 %
Eosinophils Absolute: 0.1 10*3/uL (ref 0.0–0.5)
Eosinophils Relative: 2 %
HCT: 39.2 % (ref 36.0–46.0)
Hemoglobin: 12.9 g/dL (ref 12.0–15.0)
Immature Granulocytes: 0 %
Lymphocytes Relative: 35 %
Lymphs Abs: 1.1 10*3/uL (ref 0.7–4.0)
MCH: 30.1 pg (ref 26.0–34.0)
MCHC: 32.9 g/dL (ref 30.0–36.0)
MCV: 91.4 fL (ref 80.0–100.0)
Monocytes Absolute: 0.2 10*3/uL (ref 0.1–1.0)
Monocytes Relative: 8 %
Neutro Abs: 1.7 10*3/uL (ref 1.7–7.7)
Neutrophils Relative %: 54 %
Platelets: 257 10*3/uL (ref 150–400)
RBC: 4.29 MIL/uL (ref 3.87–5.11)
RDW: 12.4 % (ref 11.5–15.5)
WBC: 3.1 10*3/uL — ABNORMAL LOW (ref 4.0–10.5)
nRBC: 0 % (ref 0.0–0.2)

## 2020-04-11 LAB — VITAMIN B12: Vitamin B-12: 584 pg/mL (ref 180–914)

## 2020-04-11 LAB — CMP (CANCER CENTER ONLY)
ALT: 26 U/L (ref 0–44)
AST: 21 U/L (ref 15–41)
Albumin: 4.3 g/dL (ref 3.5–5.0)
Alkaline Phosphatase: 65 U/L (ref 38–126)
Anion gap: 8 (ref 5–15)
BUN: 9 mg/dL (ref 6–20)
CO2: 29 mmol/L (ref 22–32)
Calcium: 9.6 mg/dL (ref 8.9–10.3)
Chloride: 103 mmol/L (ref 98–111)
Creatinine: 1.11 mg/dL — ABNORMAL HIGH (ref 0.44–1.00)
GFR, Estimated: 57 mL/min — ABNORMAL LOW (ref >60)
Glucose, Bld: 78 mg/dL (ref 70–99)
Potassium: 4 mmol/L (ref 3.5–5.1)
Sodium: 140 mmol/L (ref 135–145)
Total Bilirubin: 0.7 mg/dL (ref 0.3–1.2)
Total Protein: 7.8 g/dL (ref 6.5–8.1)

## 2020-04-11 LAB — FERRITIN: Ferritin: 92 ng/mL (ref 11–307)

## 2020-04-11 LAB — LACTATE DEHYDROGENASE: LDH: 176 U/L (ref 98–192)

## 2020-04-11 LAB — SEDIMENTATION RATE: Sed Rate: 18 mm/hr (ref 0–22)

## 2020-04-11 NOTE — Patient Instructions (Signed)
Thank you for choosing [facility] to provide your oncology and hematology care.   Should you have questions after your visit to the [facility] ([facility]), please contact this office at [phone] between 8:30 AM and 4:30 PM.  Voice mails left after 4:00 PM may not be returned until the following business day.  Calls received after 4:30 PM will be answered by an off-site Nurse Triage Line.    Prescription Refills:  Please have your pharmacy contact us directly for most prescription requests.  Contact the office directly for refills of narcotics (pain medications). Allow 48-72 hours for refills.  Appointments: Please contact the [facility] scheduling department [phone] for questions regarding [facility] appointment scheduling.  Contact the schedulers with any scheduling changes so that your appointment can be rescheduled in a timely manner.   Central Scheduling for [place] [phone] - Call to schedule procedures such as PET scans, CT scans, MRI, Ultrasound, etc.  To afford each patient quality time with our providers, please arrive 30 minutes before your scheduled appointment time.  If you arrive late for your appointment, you may be asked to reschedule.  We strive to give you quality time with our providers, and arriving late affects you and other patients whose appointments are after yours. If you are a no show for multiple scheduled visits, you may be dismissed from the clinic at the providers discretion.     Resources: [facility] Social Workers [phone] for additional information on assistance programs or assistance connecting with community support programs   [place] DSS  [phone]: Information regarding food stamps, Medicaid, and utility assistance [place] Access [place] 336-333-6589   [place] [employer]'s shared-ride transportation service for eligible riders who have a disability that prevents them from riding the fixed route bus.   Medicare  Rights Center 800-333-4114 Helps people with Medicare understand their rights and benefits, navigate the Medicare system, and secure the quality healthcare they deserve [employer] 800-227-2345 Assists patients locate various types of support and financial assistance Cancer Care: 1-800-813-HOPE (4673) Provides financial assistance, online support groups, medication/co-pay assistance.   Transportation Assistance for appointments at [facility]: Transportation Coordinator [phone]  Again, thank you for choosing [facility] for your care.       

## 2020-04-11 NOTE — Progress Notes (Signed)
HEMATOLOGY/ONCOLOGY CONSULTATION NOTE  Date of Service: 04/11/2020  Patient Care Team: Windell Hummingbird, PA-C as PCP - General (Physician Assistant)  CHIEF COMPLAINTS/PURPOSE OF CONSULTATION:  Leukopenia  HISTORY OF PRESENTING ILLNESS:   Savannah Clark is a wonderful 59 y.o. female who has been referred to [place] by Dr. Simona Huh for evaluation and management of leukopenia. The pt reports that she is doing well overall.   The pt reports that her last labs were taken as a part of routine process and not due to any symptoms. Pt denies frequent infections or any recent infections. Pt has a propensity to anemia and has recently restarted taking a prenatal vitamin. She also has a history of Vitamin D deficiency. Pt is not currently taking Naproxen or any other OTC pain medications. She discontinued taking B-complex vitamins several months ago. In the last year she has been started on Ozempic, Prozac, Zofran, Etodolac, & Oxycodone. Pt was started on Etodolac about 5 months ago, but uses them infrequently after receiving her steroid injections for her back pain. Pt last took 1/2 Oxycodone two weeks ago, which is also used to control her back pain. Pt has lost 40-65 lbs in the last 6 months due to Ozempic, which lowers her appetite. She has reached a weight loss plateau.   Pt has been able to maintain a mostly gluten free diet after finding out her Celiac disease. Pt experiences pancreatitis and an overactive bladder when her gluten allergy is triggered. Pt has never been diagnosed with a thyroid disorder. Pt has bilateral swelling in her neck every few years that is thought to be caused by recurrent infectious mononucleosis. She also has eczema, which is treated with creams and Hydroxyzine.  Most recent lab results (09/06/2019) of CBC is as follows: all values are WNL except for MCHC at 30.7, MPV at 10.7, Eos Rel at 5.1. 04/26/2019 WBC at 3.1K  On review of systems, pt reports weight loss, low appetite  and denies fevers, chills, night sweats, abdominal pain and any other symptoms.   On PMHx the pt reports Celiac disease, Pancreatitis, Mononucleosis, Diabetes, Anemia, Vitamin D deficiency, Eczema. On Social Hx the pt reports that she is non smoker and does not drink much alcohol. Pt denies any other drug use.   MEDICAL HISTORY:  Past Medical History:  Diagnosis Date  . Anemia   . Anxiety   . Asthma   . Bronchitis   . Celiac disease   . Depression   . Night terror disorder   . Pancreatitis, chronic ([place])   . PTSD (post-traumatic stress disorder)   . Renal disorder    mild kidney disease    SURGICAL HISTORY: Past Surgical History:  Procedure Laterality Date  . FOOT SURGERY      SOCIAL HISTORY: Social History   Socioeconomic History  . Marital status: Single    Spouse name: Not on file  . Number of children: Not on file  . Years of education: Not on file  . Highest education level: Not on file  Occupational History  . Not on file  Tobacco Use  . Smoking status: Former Smoker    Quit date: 06/09/2007    Years since quitting: 12.8  . Smokeless tobacco: Never Used  Substance and Sexual Activity  . Alcohol use: Yes    Comment: rare  . Drug use: No  . Sexual activity: Not on file  Other Topics Concern  . Not on file  Social History Narrative  . Not on  file   Social Determinants of Health   Financial Resource Strain:   . Difficulty of Paying Living Expenses: Not on file  Food Insecurity:   . Worried About Charity fundraiser in the Last Year: Not on file  . Ran Out of Food in the Last Year: Not on file  Transportation Needs:   . Lack of Transportation (Medical): Not on file  . Lack of Transportation (Non-Medical): Not on file  Physical Activity:   . Days of Exercise per Week: Not on file  . Minutes of Exercise per Session: Not on file  Stress:   . Feeling of Stress : Not on file  Social Connections:   . Frequency of Communication with Friends and Family: Not  on file  . Frequency of Social Gatherings with Friends and Family: Not on file  . Attends Religious Services: Not on file  . Active Member of Clubs or Organizations: Not on file  . Attends Archivist Meetings: Not on file  . Marital Status: Not on file  Intimate Partner Violence:   . Fear of Current or Ex-Partner: Not on file  . Emotionally Abused: Not on file  . Physically Abused: Not on file  . Sexually Abused: Not on file    FAMILY HISTORY: No family history on file.  ALLERGIES:  is allergic to adhesive [tape], amoxicillin-pot clavulanate, aspirin, farxiga [dapagliflozin], gluten meal, januvia [sitagliptin], lactose intolerance (gi), and wheat.  MEDICATIONS:  Current Outpatient Medications  Medication Sig Dispense Refill  . albuterol (PROVENTIL HFA;VENTOLIN HFA) 108 (90 BASE) MCG/ACT inhaler Inhale 2 puffs into the lungs every 6 (six) hours as needed. wheezing     . b complex vitamins tablet Take 1 tablet by mouth daily.   (Patient not taking: Reported on 01/09/2020)    . Biotin 5000 MCG CAPS Take by mouth.    Marland Kitchen buPROPion (WELLBUTRIN XL) 300 MG 24 hr tablet Take 1 tablet (300 mg total) by mouth every morning. 30 tablet 2  . busPIRone (BUSPAR) 5 MG tablet Take 15 mg by mouth 2 (two) times daily.  (Patient not taking: Reported on 01/09/2020)    . CALCIUM PO Take 1 tablet by mouth daily.    . cetirizine (ZYRTEC) 5 MG chewable tablet Chew 2 tablets (10 mg total) by mouth daily. 30 tablet 0  . clindamycin (CLEOCIN) 300 MG capsule Take 1 capsule (300 mg total) by mouth 4 (four) times daily. X 7 days (Patient not taking: Reported on 01/09/2020) 28 capsule 0  . diphenhydrAMINE (BENADRYL) 25 MG tablet Take 25 mg by mouth every 6 (six) hours as needed. (Patient not taking: Reported on 01/09/2020)    . FLUoxetine (PROZAC) 10 MG capsule Take 1 capsule (10 mg total) by mouth daily. 30 capsule 2  . fluticasone (FLONASE) 50 MCG/ACT nasal spray Place 1 spray into both nostrils daily for 5 doses.  9.9 g 0  . Fluticasone-Salmeterol (ADVAIR) 250-50 MCG/DOSE AEPB Inhale 1 puff into the lungs 2 (two) times daily.    . hydrOXYzine (ATARAX/VISTARIL) 50 MG tablet Take 1 tablet (50 mg total) by mouth in the morning and at bedtime. 60 tablet 2  . magnesium 30 MG tablet Take 30 mg by mouth 2 (two) times daily.    . naproxen (NAPROSYN) 500 MG tablet Take 1 tablet (500 mg total) by mouth 2 (two) times daily. (Patient not taking: Reported on 01/09/2020) 30 tablet 0  . ondansetron (ZOFRAN ODT) 8 MG disintegrating tablet 70m ODT q8  hours prn nausea  6 tablet 0  . pantoprazole (PROTONIX) 40 MG tablet Take 40 mg by mouth daily. (Patient not taking: Reported on 01/09/2020)    . prazosin (MINIPRESS) 1 MG capsule Take 1 mg by mouth at bedtime. (Patient not taking: Reported on 01/09/2020)    . Prenatal Vit-Fe Fumarate-FA (PRENATAL MULTIVITAMIN) TABS tablet Take 1 tablet by mouth daily at 12 noon.    . prenatal vitamin w/FE, FA (PRENATAL 1 + 1) 27-1 MG TABS tablet Take 1 tablet by mouth daily at 12 noon. (Patient not taking: Reported on 01/09/2020)    . topiramate (TOPAMAX) 50 MG tablet Take 50 mg by mouth 2 (two) times daily.   (Patient not taking: Reported on 01/09/2020)     No current facility-administered medications for this visit.    REVIEW OF SYSTEMS:    10 Point review of Systems was done is negative except as noted above.  PHYSICAL EXAMINATION: ECOG PERFORMANCE STATUS: 0 - Asymptomatic  . Vitals:   04/11/20 1116  BP: 136/90  Pulse: 78  Resp: 18  Temp: 97.8 F (36.6 C)  SpO2: 100%   Filed Weights   04/11/20 1116  Weight: 131 lb 6.4 oz (59.6 kg)   .Body mass index is 20.58 kg/m.  GENERAL:alert, in no acute distress and comfortable SKIN: no acute rashes, no significant lesions EYES: conjunctiva are pink and non-injected, sclera anicteric OROPHARYNX: MMM, no exudates, no oropharyngeal erythema or ulceration NECK: supple, no JVD LYMPH:  no palpable lymphadenopathy in the cervical, axillary or  inguinal regions LUNGS: clear to auscultation b/l with normal respiratory effort HEART: regular rate & rhythm ABDOMEN:  normoactive bowel sounds , non tender, not distended. Extremity: no pedal edema PSYCH: alert & oriented x 3 with fluent speech NEURO: no focal motor/sensory deficits  LABORATORY DATA:  I have reviewed the data as listed  . CBC Latest Ref Rng & Units 04/11/2020 04/22/2011 04/22/2011  WBC 4.0 - 10.5 K/uL 3.1(L) - 5.3  Hemoglobin 12.0 - 15.0 g/dL 12.9 12.9 12.2  Hematocrit 36 - 46 % 39.2 38.0 35.6(L)  Platelets 150 - 400 K/uL 257 - 212   ANC 1700  . CMP Latest Ref Rng & Units 04/11/2020 04/22/2011 04/10/2011  Glucose 70 - 99 mg/dL 78 86 106(H)  BUN 6 - 20 mg/dL 9 16 14   Creatinine 0.44 - 1.00 mg/dL 1.11(H) 1.00 0.90  Sodium 135 - 145 mmol/L 140 141 139  Potassium 3.5 - 5.1 mmol/L 4.0 3.8 4.2  Chloride 98 - 111 mmol/L 103 107 104  CO2 22 - 32 mmol/L 29 - -  Calcium 8.9 - 10.3 mg/dL 9.6 - -  Total Protein 6.5 - 8.1 g/dL 7.8 - -  Total Bilirubin 0.3 - 1.2 mg/dL 0.7 - -  Alkaline Phos 38 - 126 U/L 65 - -  AST 15 - 41 U/L 21 - -  ALT 0 - 44 U/L 26 - -     RADIOGRAPHIC STUDIES: I have personally reviewed the radiological images as listed and agreed with the findings in the report. No results found.  ASSESSMENT & PLAN:   59 yo with  1) Leukopenia (no neutropenia). WBC/ANC 3.1k/1.7k PLAN: -Discussed patient's most recent labs from 09/06/2019, all values are WNL except for MCHC at 30.7, MPV at 10.7, Eos Rel at 5.1. -Discussed 04/26/2019 WBC at 3.1K -Advised pt that her leukopenia is not constant or progressive, which is less worrisome.  -Advised pt that if other cell lines were also low we would be more concerned about a primary  bone marrow disorder. -Advised pt that an autoimmune reaction to Celiac disease or nutritional deficiencies related to Celiac disease can cause lower WBC.  -Advised pt that rapid weight loss can cause nutritional deficiencies and change  the distribution of WBC.  -Will get labs today -Will see back in 2 weeks via phone   FOLLOW UP: Labs today Phone visit with Dr Irene Limbo in 2 weeks  . Orders Placed This Encounter  Procedures  . CBC with Differential/Platelet    Standing Status:   Future    Number of Occurrences:   1    Standing Expiration Date:   04/11/2021  . CMP ([place] [facility] only)    Standing Status:   Future    Number of Occurrences:   1    Standing Expiration Date:   04/11/2021  . Lactate dehydrogenase    Standing Status:   Future    Number of Occurrences:   1    Standing Expiration Date:   04/11/2021  . Vitamin B12    Standing Status:   Future    Number of Occurrences:   1    Standing Expiration Date:   04/11/2021  . Folate RBC    Standing Status:   Future    Number of Occurrences:   1    Standing Expiration Date:   04/11/2021  . Sedimentation rate    Standing Status:   Future    Number of Occurrences:   1    Standing Expiration Date:   04/11/2021  . Multiple Myeloma Panel (SPEP&IFE w/QIG)    Standing Status:   Future    Number of Occurrences:   1    Standing Expiration Date:   04/11/2021  . Ferritin    Standing Status:   Future    Number of Occurrences:   1    Standing Expiration Date:   04/11/2021  . Copper, serum    Standing Status:   Future    Number of Occurrences:   1    Standing Expiration Date:   04/11/2021  . ANA, IFA (with reflex)    Standing Status:   Future    Number of Occurrences:   1    Standing Expiration Date:   04/11/2021    All of the patients questions were answered with apparent satisfaction. The patient knows to call the clinic with any problems, questions or concerns.  I spent 30 mins counseling the patient face to face. The total time spent in the appointment was 45 minutes and more than 50% was on counseling and direct patient cares.    Sullivan Lone MD [place] AAHIVMS [facility] Hematology/Oncology Physician [facility]  (Office):       [phone] (Work cell):   [phone] (Fax):           [phone]  04/11/2020 4:49 PM  I, Yevette Edwards, am acting as a scribe for Dr. Sullivan Lone.   .I have reviewed the above documentation for accuracy and completeness, and I agree with the above. Brunetta Genera MD

## 2020-04-12 LAB — FOLATE RBC
Folate, Hemolysate: 332 ng/mL
Folate, RBC: 856 ng/mL (ref >498)
Hematocrit: 38.8 % (ref 34.0–46.6)

## 2020-04-14 LAB — ANTINUCLEAR ANTIBODIES, IFA: ANA Ab, IFA: NEGATIVE

## 2020-04-14 LAB — COPPER, SERUM: Copper: 126 ug/dL (ref 80–158)

## 2020-04-15 LAB — MULTIPLE MYELOMA PANEL, SERUM
Albumin SerPl Elph-Mcnc: 4.2 g/dL (ref 2.9–4.4)
Albumin/Glob SerPl: 1.4 (ref 0.7–1.7)
Alpha 1: 0.3 g/dL (ref 0.0–0.4)
Alpha2 Glob SerPl Elph-Mcnc: 0.6 g/dL (ref 0.4–1.0)
B-Globulin SerPl Elph-Mcnc: 1.1 g/dL (ref 0.7–1.3)
Gamma Glob SerPl Elph-Mcnc: 1.2 g/dL (ref 0.4–1.8)
Globulin, Total: 3.2 g/dL (ref 2.2–3.9)
IgA: 295 mg/dL (ref 87–352)
IgG (Immunoglobin G), Serum: 1107 mg/dL (ref 586–1602)
IgM (Immunoglobulin M), Srm: 240 mg/dL — ABNORMAL HIGH (ref 26–217)
Total Protein ELP: 7.4 g/dL (ref 6.0–8.5)

## 2020-04-26 ENCOUNTER — Inpatient Hospital Stay (HOSPITAL_BASED_OUTPATIENT_CLINIC_OR_DEPARTMENT_OTHER): Payer: Medicaid Other | Admitting: Hematology

## 2020-04-26 DIAGNOSIS — D72819 Decreased white blood cell count, unspecified: Secondary | ICD-10-CM

## 2020-04-26 NOTE — Progress Notes (Signed)
HEMATOLOGY/ONCOLOGY CONSULTATION NOTE  Date of Service: 04/26/2020  Patient Care Team: [name], PA-C as PCP - General (Physician Assistant)  CHIEF COMPLAINTS/PURPOSE OF CONSULTATION:  Chronic Leukopenia  HISTORY OF PRESENTING ILLNESS:   Savannah Clark is a wonderful 59 y.o. female who has been referred to [place] by Dr. Courtney Paris for evaluation and management of leukopenia. The pt reports that she is doing well overall.   The pt reports that her last labs were taken as a part of routine process and not due to any symptoms. Pt denies frequent infections or any recent infections. Pt has a propensity to anemia and has recently restarted taking a prenatal vitamin. She also has a history of Vitamin D deficiency. Pt is not currently taking Naproxen or any other OTC pain medications. She discontinued taking B-complex vitamins several months ago. In the last year she has been started on Ozempic, Prozac, Zofran, Etodolac, & Oxycodone. Pt was started on Etodolac about 5 months ago, but uses them infrequently after receiving her steroid injections for her back pain. Pt last took 1/2 Oxycodone two weeks ago, which is also used to control her back pain. Pt has lost 40-65 lbs in the last 6 months due to Ozempic, which lowers her appetite. She has reached a weight loss plateau.   Pt has been able to maintain a mostly gluten free diet after finding out her Celiac disease. Pt experiences pancreatitis and an overactive bladder when her gluten allergy is triggered. Pt has never been diagnosed with a thyroid disorder. Pt has bilateral swelling in her neck every few years that is thought to be caused by recurrent infectious mononucleosis. She also has eczema, which is treated with creams and Hydroxyzine.  Most recent lab results (09/06/2019) of CBC is as follows: all values are WNL except for MCHC at 30.7, MPV at 10.7, Eos Rel at 5.1. 04/26/2019 WBC at 3.1K  On review of systems, pt reports weight loss, low  appetite and denies fevers, chills, night sweats, abdominal pain and any other symptoms.   On PMHx the pt reports Celiac disease, Pancreatitis, Mononucleosis, Diabetes, Anemia, Vitamin D deficiency, Eczema. On Social Hx the pt reports that she is non smoker and does not drink much alcohol. Pt denies any other drug use.   INTERVAL HISTORY: I connected with  Savannah Clark on 04/26/20 by telephone and verified that I am speaking with the correct person using two identifiers.   I discussed the limitations of evaluation and management by telemedicine. The patient expressed understanding and agreed to proceed.  Other persons participating in the visit and their role in the encounter:      -Carollee Herter, Medical Scribe  Patient's location: Home Provider's location: [facility] [employer] [name] is a wonderful 59 y.o. female who is here for evaluation and management of leukopenia. The patient's last visit with [place] was on 04/11/2020. The pt reports that she is doing well overall.  The pt reports that she continues to experience fatigue. Pt has started taking several OTC supplements including Selenium and Fish oil.   Lab results (04/11/2020) of CBC w/diff and CMP is as follows: all values are WNL except for WBC at 3.1K, Creatinine at 1.11, GFR Est at 57. 04/11/2020 LDH at 176 04/11/2020 Ferritin at 92 04/11/2020 Copper at 126 04/11/2020 Sed Rate at 18 04/11/2020 Vitamin B12 at 584 04/11/2020 ANA Ab, IFA is Negative 04/11/2020 Folate Hemolysate at 332.0, Folate RBC at 856 04/11/2020 MMP shows all values are WNL  except for: IgM at 240  On review of systems, pt reports fatigue and denies any other symptoms.   MEDICAL HISTORY:  Past Medical History:  Diagnosis Date  . Anemia   . Anxiety   . Asthma   . Bronchitis   . Celiac disease   . Depression   . Night terror disorder   . Pancreatitis, chronic ([facility])   . PTSD (post-traumatic stress disorder)   . Renal disorder    mild kidney  disease    SURGICAL HISTORY: Past Surgical History:  Procedure Laterality Date  . FOOT SURGERY      SOCIAL HISTORY: Social History   Socioeconomic History  . Marital status: Single    Spouse name: Not on file  . Number of children: Not on file  . Years of education: Not on file  . Highest education level: Not on file  Occupational History  . Not on file  Tobacco Use  . Smoking status: Former Smoker    Quit date: 06/09/2007    Years since quitting: 12.8  . Smokeless tobacco: Never Used  Substance and Sexual Activity  . Alcohol use: Yes    Comment: rare  . Drug use: No  . Sexual activity: Not on file  Other Topics Concern  . Not on file  Social History Narrative  . Not on file   Social Determinants of Health   Financial Resource Strain:   . Difficulty of Paying Living Expenses: Not on file  Food Insecurity:   . Worried About Programme researcher, broadcasting/film/video in the Last Year: Not on file  . Ran Out of Food in the Last Year: Not on file  Transportation Needs:   . Lack of Transportation (Medical): Not on file  . Lack of Transportation (Non-Medical): Not on file  Physical Activity:   . Days of Exercise per Week: Not on file  . Minutes of Exercise per Session: Not on file  Stress:   . Feeling of Stress : Not on file  Social Connections:   . Frequency of Communication with Friends and Family: Not on file  . Frequency of Social Gatherings with Friends and Family: Not on file  . Attends Religious Services: Not on file  . Active Member of Clubs or Organizations: Not on file  . Attends Banker Meetings: Not on file  . Marital Status: Not on file  Intimate Partner Violence:   . Fear of Current or Ex-Partner: Not on file  . Emotionally Abused: Not on file  . Physically Abused: Not on file  . Sexually Abused: Not on file    FAMILY HISTORY: No family history on file.  ALLERGIES:  is allergic to adhesive [tape], amoxicillin-pot clavulanate, aspirin, farxiga  [dapagliflozin], gluten meal, januvia [sitagliptin], lactose intolerance (gi), and wheat.  MEDICATIONS:  Current Outpatient Medications  Medication Sig Dispense Refill  . albuterol (PROVENTIL HFA;VENTOLIN HFA) 108 (90 BASE) MCG/ACT inhaler Inhale 2 puffs into the lungs every 6 (six) hours as needed. wheezing     . b complex vitamins tablet Take 1 tablet by mouth daily.   (Patient not taking: Reported on 01/09/2020)    . Biotin 5000 MCG CAPS Take by mouth.    Marland Kitchen buPROPion (WELLBUTRIN XL) 300 MG 24 hr tablet Take 1 tablet (300 mg total) by mouth every morning. 30 tablet 2  . busPIRone (BUSPAR) 5 MG tablet Take 15 mg by mouth 2 (two) times daily.  (Patient not taking: Reported on 01/09/2020)    . CALCIUM PO  Take 1 tablet by mouth daily.    . cetirizine (ZYRTEC) 5 MG chewable tablet Chew 2 tablets (10 mg total) by mouth daily. 30 tablet 0  . clindamycin (CLEOCIN) 300 MG capsule Take 1 capsule (300 mg total) by mouth 4 (four) times daily. X 7 days (Patient not taking: Reported on 01/09/2020) 28 capsule 0  . diphenhydrAMINE (BENADRYL) 25 MG tablet Take 25 mg by mouth every 6 (six) hours as needed. (Patient not taking: Reported on 01/09/2020)    . FLUoxetine (PROZAC) 10 MG capsule Take 1 capsule (10 mg total) by mouth daily. 30 capsule 2  . fluticasone (FLONASE) 50 MCG/ACT nasal spray Place 1 spray into both nostrils daily for 5 doses. 9.9 g 0  . Fluticasone-Salmeterol (ADVAIR) 250-50 MCG/DOSE AEPB Inhale 1 puff into the lungs 2 (two) times daily.    . hydrOXYzine (ATARAX/VISTARIL) 50 MG tablet Take 1 tablet (50 mg total) by mouth in the morning and at bedtime. 60 tablet 2  . magnesium 30 MG tablet Take 30 mg by mouth 2 (two) times daily.    . naproxen (NAPROSYN) 500 MG tablet Take 1 tablet (500 mg total) by mouth 2 (two) times daily. (Patient not taking: Reported on 01/09/2020) 30 tablet 0  . ondansetron (ZOFRAN ODT) 8 MG disintegrating tablet 8mg  ODT q8  hours prn nausea 6 tablet 0  . pantoprazole (PROTONIX) 40  MG tablet Take 40 mg by mouth daily. (Patient not taking: Reported on 01/09/2020)    . prazosin (MINIPRESS) 1 MG capsule Take 1 mg by mouth at bedtime. (Patient not taking: Reported on 01/09/2020)    . Prenatal Vit-Fe Fumarate-FA (PRENATAL MULTIVITAMIN) TABS tablet Take 1 tablet by mouth daily at 12 noon.    . prenatal vitamin w/FE, FA (PRENATAL 1 + 1) 27-1 MG TABS tablet Take 1 tablet by mouth daily at 12 noon. (Patient not taking: Reported on 01/09/2020)    . topiramate (TOPAMAX) 50 MG tablet Take 50 mg by mouth 2 (two) times daily.   (Patient not taking: Reported on 01/09/2020)     No current facility-administered medications for this visit.    REVIEW OF SYSTEMS:   A 10+ POINT REVIEW OF SYSTEMS WAS OBTAINED including neurology, dermatology, psychiatry, cardiac, respiratory, lymph, extremities, GI, GU, Musculoskeletal, constitutional, breasts, reproductive, HEENT.  All pertinent positives are noted in the HPI.  All others are negative.   PHYSICAL EXAMINATION: ECOG PERFORMANCE STATUS: 0 - Asymptomatic  Telehealth visit 04/26/2020  LABORATORY DATA:  I have reviewed the data as listed  . CBC Latest Ref Rng & Units 04/11/2020 04/11/2020 04/22/2011  WBC 4.0 - 10.5 K/uL 3.1(L) - -  Hemoglobin 12.0 - 15.0 g/dL 04/24/2011 - 69.4  Hematocrit 34.0 - 46.6 % 38.8 39.2 38.0  Platelets 150 - 400 K/uL 257 - -   ANC 1700  . CMP Latest Ref Rng & Units 04/11/2020 04/22/2011 04/10/2011  Glucose 70 - 99 mg/dL 78 86 13/07/2010)  BUN 6 - 20 mg/dL 9 16 14   Creatinine 0.44 - 1.00 mg/dL 627(O) 3.50(K  Sodium 135 - 145 mmol/L 140 141 139  Potassium 3.5 - 5.1 mmol/L 4.0 3.8 4.2  Chloride 98 - 111 mmol/L 103 107 104  CO2 22 - 32 mmol/L 29 - -  Calcium 8.9 - 10.3 mg/dL 9.6 - -  Total Protein 6.5 - 8.1 g/dL 7.8 - -  Total Bilirubin 0.3 - 1.2 mg/dL 0.7 - -  Alkaline Phos 38 - 126 U/L 65 - -  AST 15 -  41 U/L 21 - -  ALT 0 - 44 U/L 26 - -     RADIOGRAPHIC STUDIES: I have personally reviewed the radiological images as  listed and agreed with the findings in the report. No results found.  ASSESSMENT & PLAN:   59 yo with  1) Leukopenia (no neutropenia). WBC/ANC 3.1k/1.7k PLAN: -Discussed pt labwork, 04/11/2020; all values are WNL except for WBC at 3.1K, Creatinine at 1.11, GFR Est at 57. -Discussed 04/11/2020 LDH at 176 -Discussed 04/11/2020 Ferritin at 92 -Discussed 04/11/2020 Copper at 126 -Discussed 04/11/2020 Sed Rate at 18 -Discussed 04/11/2020 Vitamin B12 at 584 -Discussed 04/11/2020 ANA Ab, IFA is Negative -Discussed 04/11/2020 Folate Hemolysate at 332.0, Folate RBC at 856 -Discussed 04/11/2020 MMP shows all values are WNL except for: IgM at 240 -Advised pt that her leukopenia is not constant or progressive, which is less worrisome - no neutropenia.  -Advised pt that an aggressive primary bone marrow disorder would likely have declared itself by now.  -Advised pt that minor cytopenias could be caused by allergies, autoimmune conditions, nutritional deficiencies, or medications.  -Advised pt that if her blood counts begin to change rapidly it would be cause for a BM Bx - not indicated at this time due to the stability of her labs.  -Recommend pt continue f/u with her PCP -Will see back in 12 months with labs   FOLLOW UP: RTC with Dr Candise Che with labs in 12 months   The total time spent in the appt was 15 minutes and more than 50% was on counseling and direct patient cares.  All of the patient's questions were answered with apparent satisfaction. The patient knows to call the clinic with any problems, questions or concerns.    Wyvonnia Lora MD MS AAHIVMS [facility] Hematology/Oncology Physician [facility]  (Office):       [phone] (Work cell):  [phone] (Fax):           [phone]  04/26/2020 11:45 AM  I, Carollee Herter, am acting as a scribe for Dr. Wyvonnia Lora.   .I have reviewed the above documentation for accuracy and completeness, and I agree with the  above. Johney Maine MD

## 2020-04-30 ENCOUNTER — Telehealth: Payer: Self-pay | Admitting: Hematology

## 2020-04-30 NOTE — Telephone Encounter (Signed)
Scheduled per 11/19 los. Called and spoke with pt, confirmed 11/21 appts

## 2020-06-08 HISTORY — PX: COLONOSCOPY: SHX174

## 2020-06-08 HISTORY — PX: ESOPHAGOGASTRODUODENOSCOPY ENDOSCOPY: SHX5814

## 2020-07-02 ENCOUNTER — Encounter (HOSPITAL_COMMUNITY): Payer: Medicaid Other | Admitting: Psychiatry

## 2020-07-24 ENCOUNTER — Other Ambulatory Visit (HOSPITAL_COMMUNITY): Payer: Self-pay | Admitting: Psychiatry

## 2020-07-24 ENCOUNTER — Telehealth (HOSPITAL_COMMUNITY): Payer: Self-pay | Admitting: *Deleted

## 2020-07-24 DIAGNOSIS — F411 Generalized anxiety disorder: Secondary | ICD-10-CM

## 2020-07-24 DIAGNOSIS — F330 Major depressive disorder, recurrent, mild: Secondary | ICD-10-CM

## 2020-07-24 MED ORDER — FLUOXETINE HCL 10 MG PO CAPS: 10.0000 mg | ORAL_CAPSULE | Freq: Every day | ORAL | 2 refills | Status: DC

## 2020-07-24 NOTE — Telephone Encounter (Signed)
Medications filled and sent to preferred pharmacy.

## 2020-07-24 NOTE — Telephone Encounter (Signed)
VM left for writer requesting a Rx be called into [facility] at [facility] for her Prozac. Record reviewed and she should be out. Will bring concern to the providers attention.

## 2020-07-31 ENCOUNTER — Ambulatory Visit (INDEPENDENT_AMBULATORY_CARE_PROVIDER_SITE_OTHER): Payer: Medicaid Other | Admitting: Psychiatry

## 2020-07-31 ENCOUNTER — Encounter (HOSPITAL_COMMUNITY): Payer: Self-pay | Admitting: Psychiatry

## 2020-07-31 ENCOUNTER — Other Ambulatory Visit: Payer: Self-pay

## 2020-07-31 DIAGNOSIS — F411 Generalized anxiety disorder: Secondary | ICD-10-CM | POA: Diagnosis not present

## 2020-07-31 DIAGNOSIS — F330 Major depressive disorder, recurrent, mild: Secondary | ICD-10-CM | POA: Diagnosis not present

## 2020-07-31 MED ORDER — HYDROXYZINE HCL 50 MG PO TABS: 50.0000 mg | ORAL_TABLET | Freq: Two times a day (BID) | ORAL | 2 refills | Status: DC

## 2020-07-31 MED ORDER — FLUOXETINE HCL 10 MG PO CAPS: 10.0000 mg | ORAL_CAPSULE | Freq: Every day | ORAL | 2 refills | Status: DC

## 2020-07-31 MED ORDER — BUPROPION HCL ER (XL) 300 MG PO TB24: 300.0000 mg | ORAL_TABLET | ORAL | 2 refills | Status: DC

## 2020-07-31 NOTE — Progress Notes (Signed)
BH MD/PA/NP OP Progress Note Virtual Visit via Telephone Note  I connected with Savannah Clark on 07/31/20 at  3:30 PM EST by telephone and verified that I am speaking with the correct person using two identifiers.  Location: Patient: home Provider: Clinic   I discussed the limitations, risks, security and privacy concerns of performing an evaluation and management service by telephone and the availability of in person appointments. I also discussed with the patient that there may be a patient responsible charge related to this service. The patient expressed understanding and agreed to proceed.   I provided 30 minutes of non-face-to-face time during this encounter.   07/31/2020 1:23 PM Savannah Clark  MRN:  [id number]  Chief Complaint: "I had covid and the flu"    HPI: 60 year old female seen today for follow up psychiatric evaluation. She has a psychiatric history of anxiety, PTSD, and depression.  She is currently managed on Prozac 10 mg daily, hydroxyzine 50 mg twice daily, and Wellbutrin XL 300 mg.  She notes that her medications are effective in managing her psychiatric conditions.   Today she was unable to log in virtually so her exam was done over the phone.  During assessment she was pleasant, cooperative, and engaged in conversation.    She notes that overall she is doing well on her medication regimen and informed provider that she has minimal anxiety and depression.  She notes that when she does become anxious or depressed she sings in prayes as it is positive coping mechanism for her.  She informed provider that her sleep fluctuates, noting that at times she oversleeps and at other times she does not get enough sleep.  She reports feeling tired most days.  Provider conducted a GAD-7 and patient scored a 5.  Provider also conducted a PHQ-9 and patient scored a 12.  Patient notes that she is on Ozempic for her diabetes and informed provider that she has lost a lot of weight.  Patient  notes at times she gets irritable when people do not keep them promises, if she hears loud noises, or people are rude.  She denies other symptoms of mania.  Today she denies SI/HI/VAH or paranoia.  Patient provided that a few weeks ago she contemplated suicide because she was days away from being homeless and did not see a future.  She notes that she finds it funny now but is grateful that the Shaune Pollack brought her out of that dark place.  She informed provider that she is now in a program to help people with mental illness and a history of homelessness find adequate housing.  Patient informed provider that she had the flu and Covid and is still suffering from some of the symptoms.  She notes that she often gets out of breath when speaking to people and has very little energy.  No medication changes made today.  Patient agreeable to continue medications as prescribed.  She will follow up with outpatient counseling for therapy.  She will follow up with provider in 3 months.  No other concerns noted at this time.  Visit Diagnosis:    ICD-10-CM   1. Mild episode of recurrent major depressive disorder (HCC)  F33.0 FLUoxetine (PROZAC) 10 MG capsule  2. Generalized anxiety disorder  F41.1 FLUoxetine (PROZAC) 10 MG capsule    hydrOXYzine (ATARAX/VISTARIL) 50 MG tablet    buPROPion (WELLBUTRIN XL) 300 MG 24 hr tablet    Past Psychiatric History: anxiety, PTSD, and depression  Past Medical History:  Past  Medical History:  Diagnosis Date  . Anemia   . Anxiety   . Asthma   . Bronchitis   . Celiac disease   . Depression   . Night terror disorder   . Pancreatitis, chronic (HCC)   . PTSD (post-traumatic stress disorder)   . Renal disorder    mild kidney disease    Past Surgical History:  Procedure Laterality Date  . FOOT SURGERY      Family Psychiatric History: Mother depression and half siblings depression   Family History: History reviewed. No pertinent family history.  Social History:   Social History   Socioeconomic History  . Marital status: Single    Spouse name: Not on file  . Number of children: Not on file  . Years of education: Not on file  . Highest education level: Not on file  Occupational History  . Not on file  Tobacco Use  . Smoking status: Former Smoker    Quit date: 06/09/2007    Years since quitting: 13.1  . Smokeless tobacco: Never Used  Substance and Sexual Activity  . Alcohol use: Yes    Comment: rare  . Drug use: No  . Sexual activity: Not on file  Other Topics Concern  . Not on file  Social History Narrative  . Not on file   Social Determinants of Health   Financial Resource Strain: Not on file  Food Insecurity: Not on file  Transportation Needs: Not on file  Physical Activity: Not on file  Stress: Not on file  Social Connections: Not on file    Allergies:  Allergies  Allergen Reactions  . Adhesive [Tape] Hives and Other (See Comments)    Blisters   . Amoxicillin-Pot Clavulanate Nausea And Vomiting  . Aspirin Other (See Comments)    Gi upset  . Farxiga [Dapagliflozin]   . Gluten Meal   . Januvia [Sitagliptin]   . Lactose Intolerance (Gi)   . Wheat Diarrhea, Nausea Only and Other (See Comments)    Joints pain    Metabolic Disorder Labs: No results found for: HGBA1C, MPG No results found for: PROLACTIN No results found for: CHOL, TRIG, HDL, CHOLHDL, VLDL, LDLCALC No results found for: TSH  Therapeutic Level Labs: No results found for: LITHIUM No results found for: VALPROATE No components found for:  CBMZ  Current Medications: Current Outpatient Medications  Medication Sig Dispense Refill  . albuterol (PROVENTIL HFA;VENTOLIN HFA) 108 (90 BASE) MCG/ACT inhaler Inhale 2 puffs into the lungs every 6 (six) hours as needed. wheezing     . b complex vitamins tablet Take 1 tablet by mouth daily.   (Patient not taking: Reported on 01/09/2020)    . Biotin 5000 MCG CAPS Take by mouth.    Marland Kitchen buPROPion (WELLBUTRIN XL) 300 MG 24  hr tablet Take 1 tablet (300 mg total) by mouth every morning. 30 tablet 2  . busPIRone (BUSPAR) 5 MG tablet Take 15 mg by mouth 2 (two) times daily.  (Patient not taking: Reported on 01/09/2020)    . CALCIUM PO Take 1 tablet by mouth daily.    . clindamycin (CLEOCIN) 300 MG capsule Take 1 capsule (300 mg total) by mouth 4 (four) times daily. X 7 days (Patient not taking: Reported on 01/09/2020) 28 capsule 0  . diphenhydrAMINE (BENADRYL) 25 MG tablet Take 25 mg by mouth every 6 (six) hours as needed. (Patient not taking: Reported on 01/09/2020)    . FLUoxetine (PROZAC) 10 MG capsule Take 1 capsule (10 mg total)  by mouth daily. 30 capsule 2  . fluticasone (FLONASE) 50 MCG/ACT nasal spray Place 1 spray into both nostrils daily for 5 doses. 9.9 g 0  . Fluticasone-Salmeterol (ADVAIR) 250-50 MCG/DOSE AEPB Inhale 1 puff into the lungs 2 (two) times daily.    . hydrOXYzine (ATARAX/VISTARIL) 50 MG tablet Take 1 tablet (50 mg total) by mouth in the morning and at bedtime. 60 tablet 2  . magnesium 30 MG tablet Take 30 mg by mouth 2 (two) times daily.    . ondansetron (ZOFRAN ODT) 8 MG disintegrating tablet 8mg  ODT q8  hours prn nausea 6 tablet 0  . pantoprazole (PROTONIX) 40 MG tablet Take 40 mg by mouth daily. (Patient not taking: Reported on 01/09/2020)    . prazosin (MINIPRESS) 1 MG capsule Take 1 mg by mouth at bedtime. (Patient not taking: Reported on 01/09/2020)    . Prenatal Vit-Fe Fumarate-FA (PRENATAL MULTIVITAMIN) TABS tablet Take 1 tablet by mouth daily at 12 noon.    . prenatal vitamin w/FE, FA (PRENATAL 1 + 1) 27-1 MG TABS tablet Take 1 tablet by mouth daily at 12 noon. (Patient not taking: Reported on 01/09/2020)    . topiramate (TOPAMAX) 50 MG tablet Take 50 mg by mouth 2 (two) times daily.   (Patient not taking: Reported on 01/09/2020)     No current facility-administered medications for this visit.     Musculoskeletal: Strength & Muscle Tone: Unable to assess due to telephone visit Gait & Station:  Unable to assess due to telephone visit Patient leans: N/A  Psychiatric Specialty Exam: Review of Systems  There were no vitals taken for this visit.There is no height or weight on file to calculate BMI.  General Appearance: Unable to assess due to telephone visit  Eye Contact:  Unable to assess due to telephone visit  Speech:  Clear and Coherent and Normal Rate  Volume:  Normal  Mood:  Euthymic and That at times she becomes anxious and depressed but reports that she is able to cope with  Affect:  Unable to assess due to telephone visit  Thought Process:  Coherent, Goal Directed and Linear  Orientation:  Full (Time, Place, and Person)  Thought Content: WDL and Logical   Suicidal Thoughts:  No  Homicidal Thoughts:  No  Memory:  Immediate;   Good Recent;   Good Remote;   Good  Judgement:  Good  Insight:  Good  Psychomotor Activity:  Unable to assess due to telephone visit  Concentration:  Concentration: Good and Attention Span: Good  Recall:  Good  Fund of Knowledge: Good  Language: Good  Akathisia:  No  Handed:  Right  AIMS (if indicated): Not done  Assets:  Communication Skills Desire for Improvement Financial Resources/Insurance Housing Social Support  ADL's:  Intact  Cognition: WNL  Sleep:  Good   Screenings: GAD-7   Flowsheet Row Clinical Support [facility] 07/31/2020 in [facility]  Total GAD-7 Score 5    PHQ2-9   Flowsheet Row Clinical Support from 07/31/2020 in [facility]  PHQ-2 Total Score 2  PHQ-9 Total Score 12    Flowsheet Row Clinical Support from 07/31/2020 in [facility]  C-SSRS RISK CATEGORY No Risk       Assessment and Plan: Patient notes at times she becomes anxious and depressed however reports that she is able to cope with it by praying and singing.  No medication changes made today.  Patient agreeable to continue all  medications as prescribed.  No other concerns  noted at this time.  1. Mild episode of recurrent major depressive disorder (HCC)  Continue- FLUoxetine (PROZAC) 10 MG capsule; Take 1 capsule (10 mg total) by mouth daily.  Dispense: 30 capsule; Refill: 2  2. Generalized anxiety disorder  Continue- FLUoxetine (PROZAC) 10 MG capsule; Take 1 capsule (10 mg total) by mouth daily.  Dispense: 30 capsule; Refill: 2 Continue- hydrOXYzine (ATARAX/VISTARIL) 50 MG tablet; Take 1 tablet (50 mg total) by mouth in the morning and at bedtime.  Dispense: 60 tablet; Refill: 2 Continue- buPROPion (WELLBUTRIN XL) 300 MG 24 hr tablet; Take 1 tablet (300 mg total) by mouth every morning.  Dispense: 30 tablet; Refill: 2  Follow-up in 3 months   Shanna Cisco, NP 07/31/2020, 1:23 PM

## 2020-10-24 ENCOUNTER — Other Ambulatory Visit: Payer: Self-pay

## 2020-10-24 ENCOUNTER — Encounter (HOSPITAL_COMMUNITY): Payer: Self-pay | Admitting: Psychiatry

## 2020-10-24 ENCOUNTER — Telehealth (INDEPENDENT_AMBULATORY_CARE_PROVIDER_SITE_OTHER): Payer: Medicaid Other | Admitting: Psychiatry

## 2020-10-24 DIAGNOSIS — F411 Generalized anxiety disorder: Secondary | ICD-10-CM

## 2020-10-24 DIAGNOSIS — F330 Major depressive disorder, recurrent, mild: Secondary | ICD-10-CM

## 2020-10-24 MED ORDER — FLUOXETINE HCL 10 MG PO CAPS: 10.0000 mg | ORAL_CAPSULE | Freq: Every day | ORAL | 2 refills | Status: DC

## 2020-10-24 MED ORDER — BUPROPION HCL ER (XL) 300 MG PO TB24: 300.0000 mg | ORAL_TABLET | ORAL | 2 refills | Status: DC

## 2020-10-24 MED ORDER — HYDROXYZINE HCL 25 MG PO TABS: 25.0000 mg | ORAL_TABLET | Freq: Three times a day (TID) | ORAL | 2 refills | Status: DC | PRN

## 2020-10-24 NOTE — Progress Notes (Signed)
BH MD/PA/NP OP Progress Note Virtual Visit via Video Note  I connected with Savannah Clark on 10/24/20 at 11:00 AM EDT by a video enabled telemedicine application and verified that I am speaking with the correct person using two identifiers.  Location: Patient: Home Provider: Clinic   I discussed the limitations of evaluation and management by telemedicine and the availability of in person appointments. The patient expressed understanding and agreed to proceed.  I provided 30 minutes of non-face-to-face time during this encounter.      10/24/2020 11:25 AM Maddux First  MRN:  [id number]  Chief Complaint: "I am okay but the pollen count is so high"    HPI: 60 year old female seen today for follow up psychiatric evaluation. She has a psychiatric history of anxiety, PTSD, and depression.  She is currently managed on Prozac 10 mg daily, hydroxyzine 50 mg twice daily, and Wellbutrin XL 300 mg.  She notes that her medications are effective in managing her psychiatric conditions.   Today she is well groomed, pleasant, cooperative, and engaged in conversation.  She informed provider that overall she is doing well.  She notes that the pollen count is high and reports that her allergies are acting up.  She notes that she started a new medication which has been helpful for her anxiety.  She requested that hydroxyzine be reduced but not discontinued as she feels that it as well as her other psychiatric medications are effective in managing her psychiatric conditions.  Today she endorses having adequate appetite and sleep.  She denies SI/HI/VAH, mania, or paranoia.    Patient was happy during the conversation.  She informed Clinical research associate that she has a new friend and reports that she is going out on several business/social events and dinners with her friend.  She also notes that recently she got all dressed up for her 60th birthday and notes that she had a good time.  Today patient requests that her hydroxyzine  be reduced.  She is agreeable to reducing hydroxyzine 50 mg twice daily daily to 25 mg 3 times daily as needed.  She will continue all other medications as prescribed.  No other concerns noted at this time.  Visit Diagnosis:    ICD-10-CM   1. Generalized anxiety disorder  F41.1 hydrOXYzine (ATARAX/VISTARIL) 25 MG tablet    FLUoxetine (PROZAC) 10 MG capsule    buPROPion (WELLBUTRIN XL) 300 MG 24 hr tablet  2. Mild episode of recurrent major depressive disorder (HCC)  F33.0 FLUoxetine (PROZAC) 10 MG capsule    Past Psychiatric History: anxiety, PTSD, and depression  Past Medical History:  Past Medical History:  Diagnosis Date  . Anemia   . Anxiety   . Asthma   . Bronchitis   . Celiac disease   . Depression   . Night terror disorder   . Pancreatitis, chronic (HCC)   . PTSD (post-traumatic stress disorder)   . Renal disorder    mild kidney disease    Past Surgical History:  Procedure Laterality Date  . FOOT SURGERY      Family Psychiatric History: Mother depression and half siblings depression   Family History: No family history on file.  Social History:  Social History   Socioeconomic History  . Marital status: Single    Spouse name: Not on file  . Number of children: Not on file  . Years of education: Not on file  . Highest education level: Not on file  Occupational History  . Not on file  Tobacco Use  .  Smoking status: Former Smoker    Quit date: 06/09/2007    Years since quitting: 13.3  . Smokeless tobacco: Never Used  Substance and Sexual Activity  . Alcohol use: Yes    Comment: rare  . Drug use: No  . Sexual activity: Not on file  Other Topics Concern  . Not on file  Social History Narrative  . Not on file   Social Determinants of Health   Financial Resource Strain: Not on file  Food Insecurity: Not on file  Transportation Needs: Not on file  Physical Activity: Not on file  Stress: Not on file  Social Connections: Not on file    Allergies:   Allergies  Allergen Reactions  . Adhesive [Tape] Hives and Other (See Comments)    Blisters   . Amoxicillin-Pot Clavulanate Nausea And Vomiting  . Aspirin Other (See Comments)    Gi upset  . Farxiga [Dapagliflozin]   . Gluten Meal   . Januvia [Sitagliptin]   . Lactose Intolerance (Gi)   . Wheat Diarrhea, Nausea Only and Other (See Comments)    Joints pain    Metabolic Disorder Labs: No results found for: HGBA1C, MPG No results found for: PROLACTIN No results found for: CHOL, TRIG, HDL, CHOLHDL, VLDL, LDLCALC No results found for: TSH  Therapeutic Level Labs: No results found for: LITHIUM No results found for: VALPROATE No components found for:  CBMZ  Current Medications: Current Outpatient Medications  Medication Sig Dispense Refill  . albuterol (PROVENTIL HFA;VENTOLIN HFA) 108 (90 BASE) MCG/ACT inhaler Inhale 2 puffs into the lungs every 6 (six) hours as needed. wheezing     . Biotin 5000 MCG CAPS Take by mouth.    Marland Kitchen buPROPion (WELLBUTRIN XL) 300 MG 24 hr tablet Take 1 tablet (300 mg total) by mouth every morning. 30 tablet 2  . CALCIUM PO Take 1 tablet by mouth daily.    Marland Kitchen FLUoxetine (PROZAC) 10 MG capsule Take 1 capsule (10 mg total) by mouth daily. 30 capsule 2  . fluticasone (FLONASE) 50 MCG/ACT nasal spray Place 1 spray into both nostrils daily for 5 doses. 9.9 g 0  . Fluticasone-Salmeterol (ADVAIR) 250-50 MCG/DOSE AEPB Inhale 1 puff into the lungs 2 (two) times daily.    . hydrOXYzine (ATARAX/VISTARIL) 25 MG tablet Take 1 tablet (25 mg total) by mouth 3 (three) times daily as needed. 90 tablet 2  . magnesium 30 MG tablet Take 30 mg by mouth 2 (two) times daily.    . ondansetron (ZOFRAN ODT) 8 MG disintegrating tablet 8mg  ODT q8  hours prn nausea 6 tablet 0   No current facility-administered medications for this visit.     Musculoskeletal: Strength & Muscle Tone: Unable to assess due to telehealth visit Gait & Station: Unable to assess due to telehealth  visit Patient leans: N/A  Psychiatric Specialty Exam: Review of Systems  There were no vitals taken for this visit.There is no height or weight on file to calculate BMI.  General Appearance: Well Groomed  Eye Contact:  Good  Speech:  Clear and Coherent and Normal Rate  Volume:  Normal  Mood:  Euthymic  Affect:  Appropriate and Congruent  Thought Process:  Coherent, Goal Directed and Linear  Orientation:  Full (Time, Place, and Person)  Thought Content: WDL and Logical   Suicidal Thoughts:  No  Homicidal Thoughts:  No  Memory:  Immediate;   Good Recent;   Good Remote;   Good  Judgement:  Good  Insight:  Good  Psychomotor Activity:  Normal  Concentration:  Concentration: Good and Attention Span: Good  Recall:  Good  Fund of Knowledge: Good  Language: Good  Akathisia:  No  Handed:  Right  AIMS (if indicated): Not done  Assets:  Communication Skills Desire for Improvement Financial Resources/Insurance Housing Social Support  ADL's:  Intact  Cognition: WNL  Sleep:  Good   Screenings: GAD-7   Flowsheet Row Clinical Support [facility] 07/31/2020 in [facility]  Total GAD-7 Score 5    PHQ2-9   Flowsheet Row Clinical Support from 07/31/2020 in [facility]  PHQ-2 Total Score 2  PHQ-9 Total Score 12    Flowsheet Row Clinical Support from 07/31/2020 in [facility]  C-SSRS RISK CATEGORY No Risk       Assessment and Plan: Patient notes that she is doing well on her current medication regimen.  She notes that she recently started a new antihistamine and request that hydroxyzine be reduced.  Today she is agreeable to reducing hydroxyzine 50 mg twice daily to 25 mg 3 times daily as needed.  She will continue all other medications as prescribed.   1. Generalized anxiety disorder  Reduced- hydrOXYzine (ATARAX/VISTARIL) 25 MG tablet; Take 1 tablet (25 mg total) by mouth 3 (three) times daily as  needed.  Dispense: 90 tablet; Refill: 2 Continue- FLUoxetine (PROZAC) 10 MG capsule; Take 1 capsule (10 mg total) by mouth daily.  Dispense: 30 capsule; Refill: 2 Continue- buPROPion (WELLBUTRIN XL) 300 MG 24 hr tablet; Take 1 tablet (300 mg total) by mouth every morning.  Dispense: 30 tablet; Refill: 2  2. Mild episode of recurrent major depressive disorder (HCC)  Continue- FLUoxetine (PROZAC) 10 MG capsule; Take 1 capsule (10 mg total) by mouth daily.  Dispense: 30 capsule; Refill: 2   Follow-up in 3 months   Shanna Cisco, NP 10/24/2020, 11:25 AM

## 2021-01-23 ENCOUNTER — Encounter (HOSPITAL_COMMUNITY): Payer: Self-pay | Admitting: Psychiatry

## 2021-01-23 ENCOUNTER — Telehealth (INDEPENDENT_AMBULATORY_CARE_PROVIDER_SITE_OTHER): Payer: Medicaid Other | Admitting: Psychiatry

## 2021-01-23 ENCOUNTER — Other Ambulatory Visit: Payer: Self-pay

## 2021-01-23 DIAGNOSIS — F330 Major depressive disorder, recurrent, mild: Secondary | ICD-10-CM

## 2021-01-23 DIAGNOSIS — F411 Generalized anxiety disorder: Secondary | ICD-10-CM | POA: Diagnosis not present

## 2021-01-23 MED ORDER — BUPROPION HCL ER (XL) 300 MG PO TB24: 300.0000 mg | ORAL_TABLET | ORAL | 3 refills | Status: DC

## 2021-01-23 MED ORDER — HYDROXYZINE HCL 25 MG PO TABS: 25.0000 mg | ORAL_TABLET | Freq: Three times a day (TID) | ORAL | 3 refills | Status: DC | PRN

## 2021-01-23 MED ORDER — FLUOXETINE HCL 10 MG PO CAPS: 10.0000 mg | ORAL_CAPSULE | Freq: Every day | ORAL | 3 refills | Status: AC

## 2021-01-23 NOTE — Progress Notes (Signed)
BH MD/PA/NP OP Progress Note Virtual Visit via Video Note  I connected with Savannah Clark on 01/23/21 at  1:00 PM EDT by a video enabled telemedicine application and verified that I am speaking with the correct person using two identifiers.  Location: Patient: Home Provider: Clinic   I discussed the limitations of evaluation and management by telemedicine and the availability of in person appointments. The patient expressed understanding and agreed to proceed.  I provided 30 minutes of non-face-to-face time during this encounter.      01/23/2021 1:15 PM Savannah Clark  MRN:  [id number]  Chief Complaint: "I am not feeling well right now but mentally I feel awesome"    HPI: 60 year old female seen today for follow up psychiatric evaluation. She has a psychiatric history of anxiety, PTSD, and depression.  She is currently managed on Prozac 10 mg daily, hydroxyzine 25 mg three times daily, and Wellbutrin XL 300 mg.  She notes that her medications are effective in managing her psychiatric conditions.    Today she is well groomed, pleasant, cooperative, and engaged in conversation.  She informed provider that she is not feeling her best today.  She notes that she has a sinus infection which has been causing her to have a headache.  She however notes that mentally she feels also.  She informed Clinical research associate that the combination of medications are effective.  Patient informed Clinical research associate that since her last visit she has done some volunteer work.  She informed Clinical research associate that she did have some issues from one of her peers which she notes was disrespectful towards her.  She notes that she reported him to their superior and politely corrected his behavior and she notes that the situation has been handled.  Patient denies symptoms of anxiety and depression.  She notes her mood is stable.  She endorses adequate sleep and poor appetite.  She informed Clinical research associate that over the last 6 months she has lost over 70 pounds however  notes that she is on Ozempic.  Today she denies SI/HI/VAH, mania, or paranoia.   No medication changes made today.  Patient agreeable to continue medications as prescribed.  No other concerns noted at this time.  Visit Diagnosis:    ICD-10-CM   1. Generalized anxiety disorder  F41.1 buPROPion (WELLBUTRIN XL) 300 MG 24 hr tablet    FLUoxetine (PROZAC) 10 MG capsule    hydrOXYzine (ATARAX/VISTARIL) 25 MG tablet    2. Mild episode of recurrent major depressive disorder (HCC)  F33.0 FLUoxetine (PROZAC) 10 MG capsule      Past Psychiatric History: anxiety, PTSD, and depression  Past Medical History:  Past Medical History:  Diagnosis Date   Anemia    Anxiety    Asthma    Bronchitis    Celiac disease    Depression    Night terror disorder    Pancreatitis, chronic (HCC)    PTSD (post-traumatic stress disorder)    Renal disorder    mild kidney disease    Past Surgical History:  Procedure Laterality Date   FOOT SURGERY      Family Psychiatric History: Mother depression and half siblings depression   Family History: History reviewed. No pertinent family history.  Social History:  Social History   Socioeconomic History   Marital status: Single    Spouse name: Not on file   Number of children: Not on file   Years of education: Not on file   Highest education level: Not on file  Occupational History  Not on file  Tobacco Use   Smoking status: Former    Types: Cigarettes    Quit date: 06/09/2007    Years since quitting: 13.6   Smokeless tobacco: Never  Substance and Sexual Activity   Alcohol use: Yes    Comment: rare   Drug use: No   Sexual activity: Not on file  Other Topics Concern   Not on file  Social History Narrative   Not on file   Social Determinants of Health   Financial Resource Strain: Not on file  Food Insecurity: Not on file  Transportation Needs: Not on file  Physical Activity: Not on file  Stress: Not on file  Social Connections: Not on file     Allergies:  Allergies  Allergen Reactions   Adhesive [Tape] Hives and Other (See Comments)    Blisters    Amoxicillin-Pot Clavulanate Nausea And Vomiting   Aspirin Other (See Comments)    Gi upset   Farxiga [Dapagliflozin]    Gluten Meal    Januvia [Sitagliptin]    Lactose Intolerance (Gi)    Wheat Diarrhea, Nausea Only and Other (See Comments)    Joints pain    Metabolic Disorder Labs: No results found for: HGBA1C, MPG No results found for: PROLACTIN No results found for: CHOL, TRIG, HDL, CHOLHDL, VLDL, LDLCALC No results found for: TSH  Therapeutic Level Labs: No results found for: LITHIUM No results found for: VALPROATE No components found for:  CBMZ  Current Medications: Current Outpatient Medications  Medication Sig Dispense Refill   albuterol (PROVENTIL HFA;VENTOLIN HFA) 108 (90 BASE) MCG/ACT inhaler Inhale 2 puffs into the lungs every 6 (six) hours as needed. wheezing      Biotin 5000 MCG CAPS Take by mouth.     buPROPion (WELLBUTRIN XL) 300 MG 24 hr tablet Take 1 tablet (300 mg total) by mouth every morning. 30 tablet 3   CALCIUM PO Take 1 tablet by mouth daily.     FLUoxetine (PROZAC) 10 MG capsule Take 1 capsule (10 mg total) by mouth daily. 30 capsule 3   fluticasone (FLONASE) 50 MCG/ACT nasal spray Place 1 spray into both nostrils daily for 5 doses. 9.9 g 0   Fluticasone-Salmeterol (ADVAIR) 250-50 MCG/DOSE AEPB Inhale 1 puff into the lungs 2 (two) times daily.     hydrOXYzine (ATARAX/VISTARIL) 25 MG tablet Take 1 tablet (25 mg total) by mouth 3 (three) times daily as needed. 90 tablet 3   magnesium 30 MG tablet Take 30 mg by mouth 2 (two) times daily.     ondansetron (ZOFRAN ODT) 8 MG disintegrating tablet 8mg  ODT q8  hours prn nausea 6 tablet 0   No current facility-administered medications for this visit.     Musculoskeletal: Strength & Muscle Tone:  Unable to assess due to telehealth visit Gait & Station:  Unable to assess due to telehealth  visit Patient leans: N/A  Psychiatric Specialty Exam: Review of Systems  There were no vitals taken for this visit.There is no height or weight on file to calculate BMI.  General Appearance: Well Groomed  Eye Contact:  Good  Speech:  Clear and Coherent and Normal Rate  Volume:  Normal  Mood:  Euthymic  Affect:  Appropriate and Congruent  Thought Process:  Coherent, Goal Directed and Linear  Orientation:  Full (Time, Place, and Person)  Thought Content: WDL and Logical   Suicidal Thoughts:  No  Homicidal Thoughts:  No  Memory:  Immediate;   Good Recent;   Good  Remote;   Good  Judgement:  Good  Insight:  Good  Psychomotor Activity:  Normal  Concentration:  Concentration: Good and Attention Span: Good  Recall:  Good  Fund of Knowledge: Good  Language: Good  Akathisia:  No  Handed:  Right  AIMS (if indicated): Not done  Assets:  Communication Skills Desire for Improvement Financial Resources/Insurance Housing Social Support  ADL's:  Intact  Cognition: WNL  Sleep:  Good   Screenings: GAD-7    Flowsheet Row Clinical Support [facility] 07/31/2020 in [facility]  Total GAD-7 Score 5      PHQ2-9    Flowsheet Row Clinical Support [facility] 07/31/2020 in [facility]  PHQ-2 Total Score 2  PHQ-9 Total Score 12      Flowsheet Row Clinical Support from 07/31/2020 in [facility]  C-SSRS RISK CATEGORY No Risk        Assessment and Plan: Patient notes that she is doing well on her current medication regimen.  No medication changes made today.  Patient agreeable to continue medications as prescribed.   1. Generalized anxiety disorder  Reduced- hydrOXYzine (ATARAX/VISTARIL) 25 MG tablet; Take 1 tablet (25 mg total) by mouth 3 (three) times daily as needed.  Dispense: 90 tablet; Refill: 2 Continue- FLUoxetine (PROZAC) 10 MG capsule; Take 1 capsule (10 mg total) by mouth daily.  Dispense: 30  capsule; Refill: 2 Continue- buPROPion (WELLBUTRIN XL) 300 MG 24 hr tablet; Take 1 tablet (300 mg total) by mouth every morning.  Dispense: 30 tablet; Refill: 2  2. Mild episode of recurrent major depressive disorder (HCC)  Continue- FLUoxetine (PROZAC) 10 MG capsule; Take 1 capsule (10 mg total) by mouth daily.  Dispense: 30 capsule; Refill: 2   Follow-up in 3 months   Shanna Cisco, NP 01/23/2021, 1:15 PM

## 2021-01-25 IMAGING — DX DG TIBIA/FIBULA 2V*R*
4 series · 4 of 4 positions shown · non-contrast
Comparison: None.

CLINICAL DATA: Infection.

EXAM:
RIGHT TIBIA AND FIBULA - 2 VIEW

[tibia ap (1 of 2)]
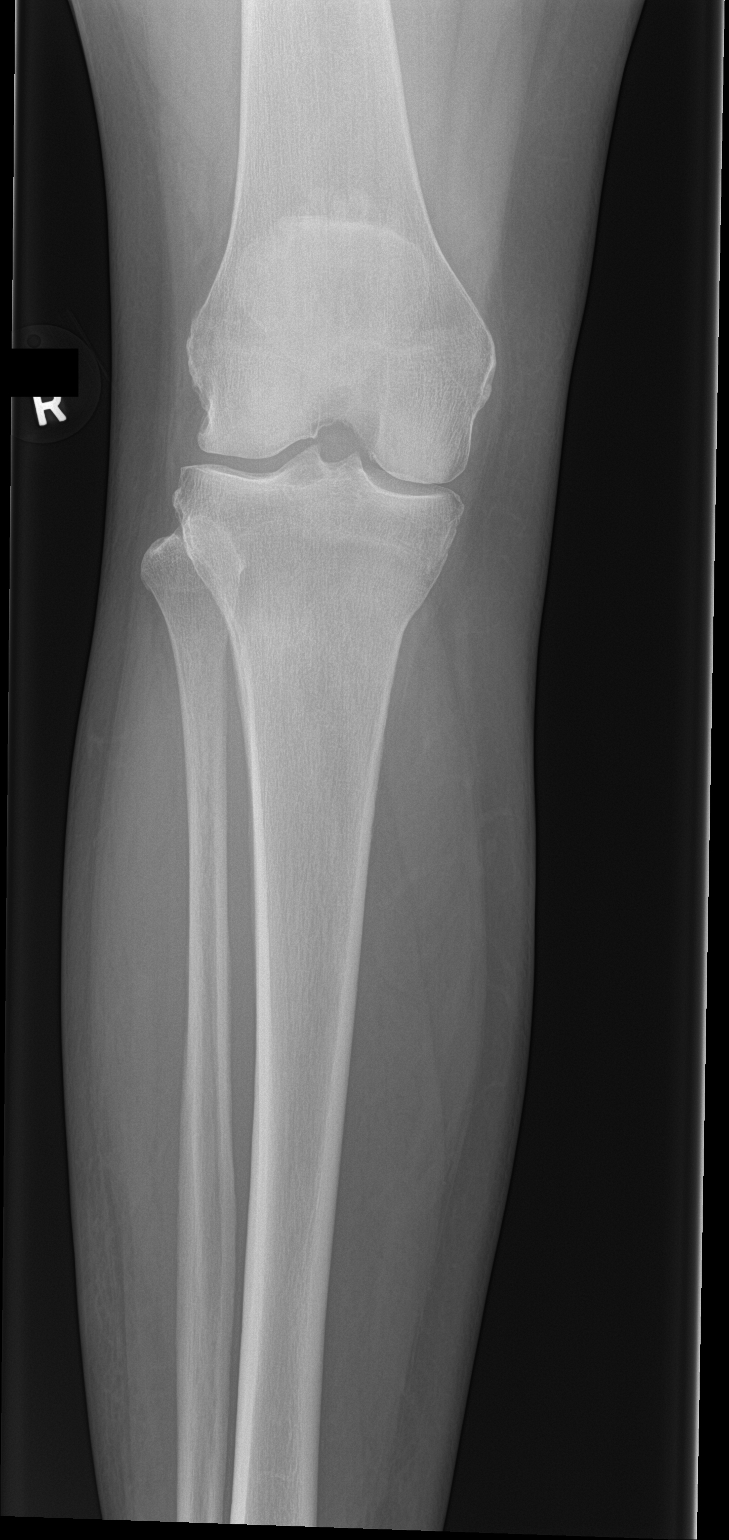

[tibia ap (2 of 2)]
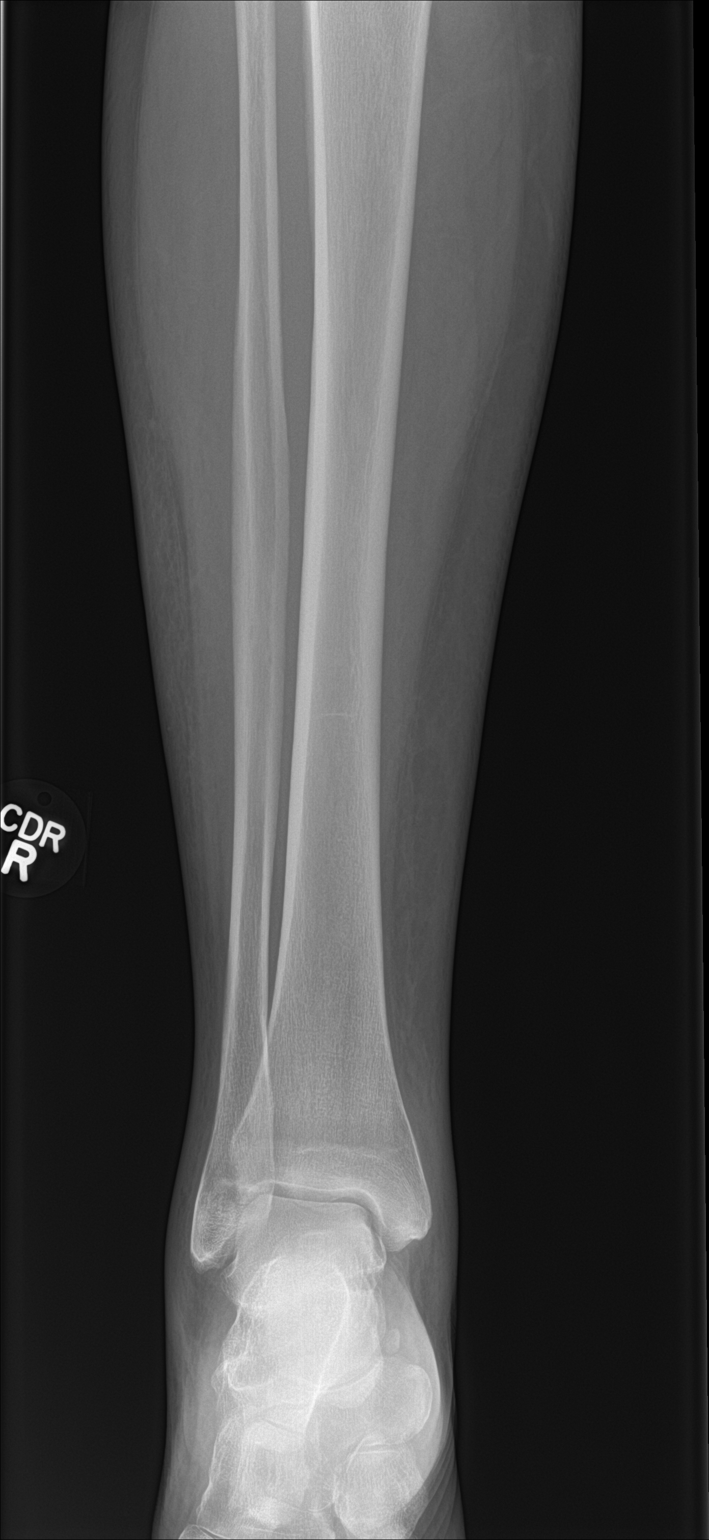

[tibia lat (1 of 2)]
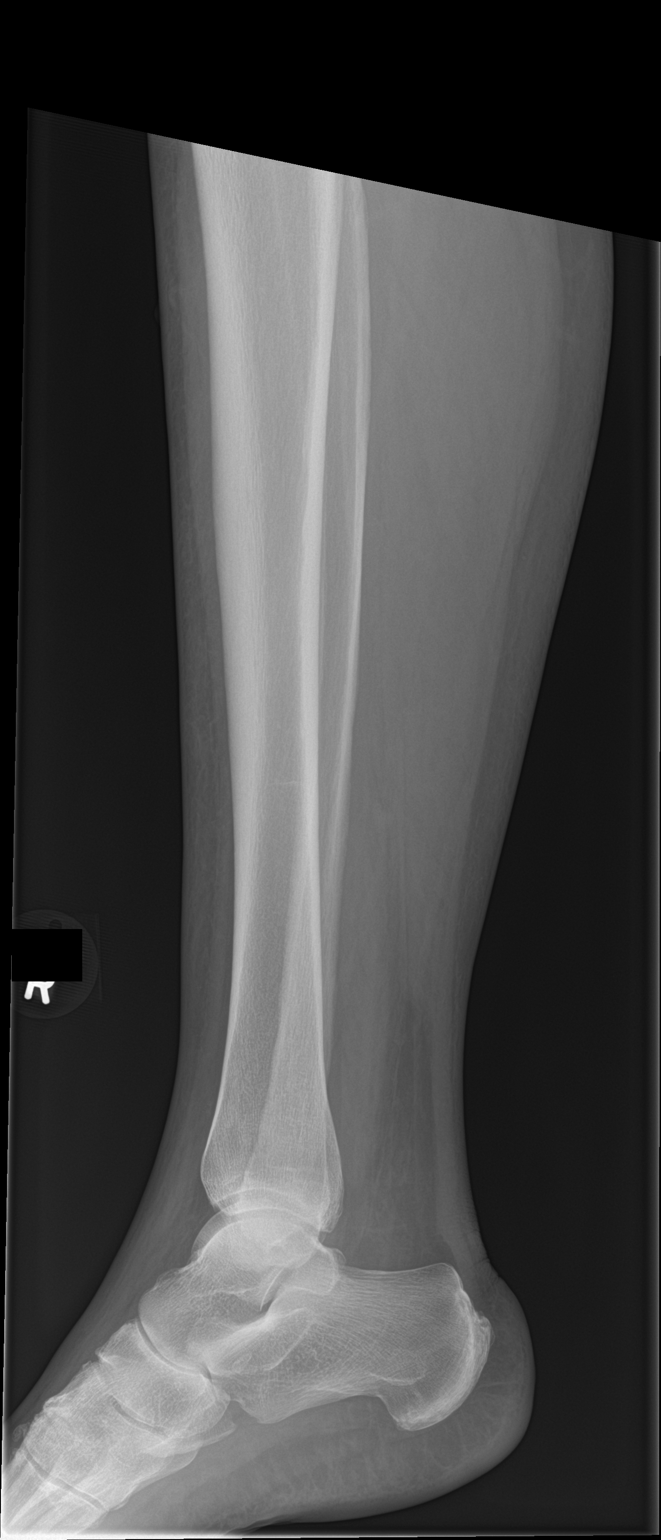

[tibia lat (2 of 2)]
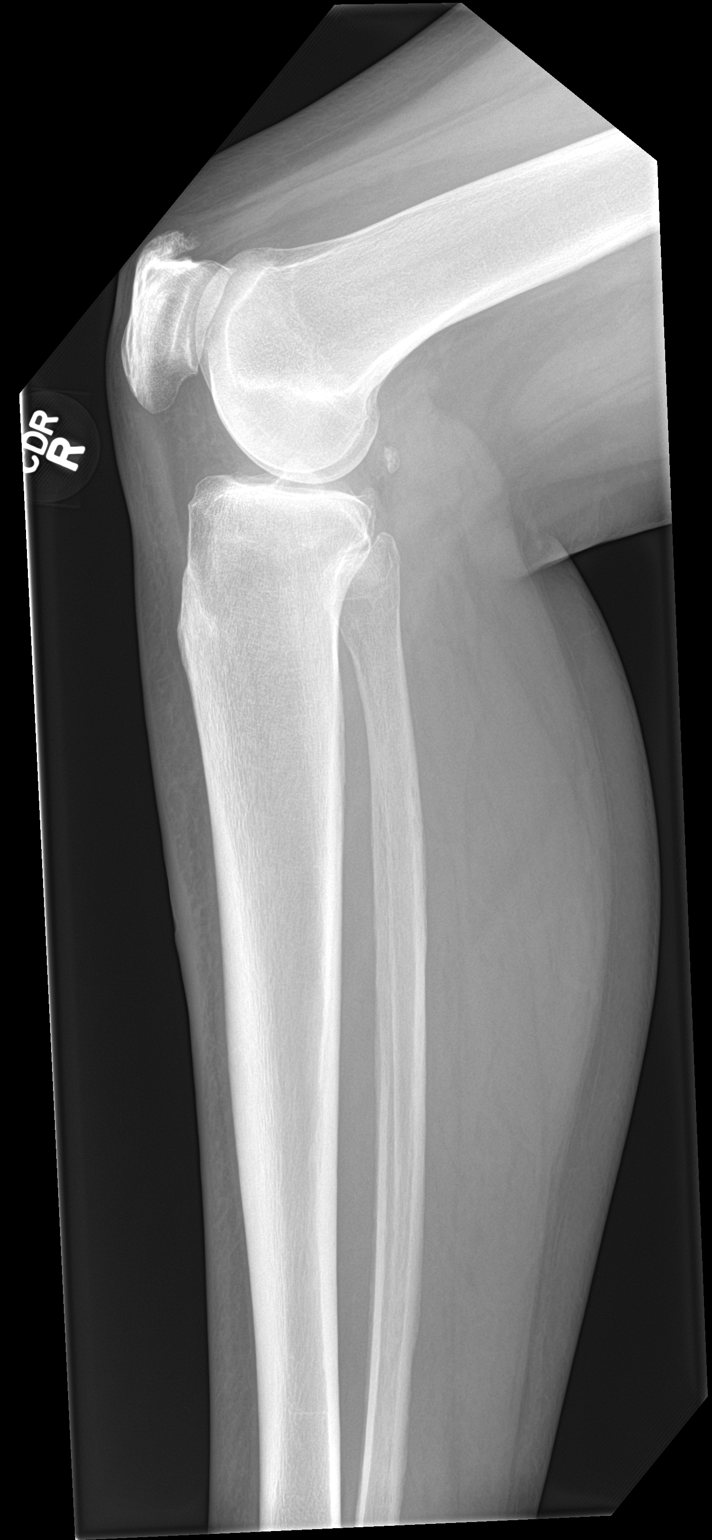

[4 of 4 positions shown; findings below may reference images not displayed]

FINDINGS: There are degenerative changes of the right knee that are
mild-to-moderate. There is some soft tissue swelling about the
lateral aspect of the right lower extremity. There is no
subcutaneous gas. There is no displaced fracture. No radiopaque
foreign body.
IMPRESSION: Mild nonspecific soft tissue swelling involving the right lower
extremity. No displaced fracture or radiopaque foreign body.

## 2021-04-28 ENCOUNTER — Inpatient Hospital Stay: Payer: Medicaid Other

## 2021-04-28 ENCOUNTER — Telehealth (HOSPITAL_COMMUNITY): Payer: Medicaid Other | Admitting: Psychiatry

## 2021-04-28 ENCOUNTER — Inpatient Hospital Stay: Payer: Medicaid Other | Attending: Hematology | Admitting: Hematology

## 2021-04-28 ENCOUNTER — Other Ambulatory Visit: Payer: Self-pay

## 2021-04-28 VITALS — BP 128/94 | HR 81 | Temp 97.9°F | Resp 16 | Ht 67.0 in | Wt 140.2 lb

## 2021-04-28 DIAGNOSIS — D72819 Decreased white blood cell count, unspecified: Secondary | ICD-10-CM | POA: Insufficient documentation

## 2021-04-28 DIAGNOSIS — Z87891 Personal history of nicotine dependence: Secondary | ICD-10-CM | POA: Insufficient documentation

## 2021-04-28 DIAGNOSIS — Z888 Allergy status to other drugs, medicaments and biological substances status: Secondary | ICD-10-CM | POA: Diagnosis not present

## 2021-04-28 DIAGNOSIS — Z886 Allergy status to analgesic agent status: Secondary | ICD-10-CM | POA: Diagnosis not present

## 2021-04-28 DIAGNOSIS — Z880 Allergy status to penicillin: Secondary | ICD-10-CM | POA: Insufficient documentation

## 2021-04-28 DIAGNOSIS — E559 Vitamin D deficiency, unspecified: Secondary | ICD-10-CM | POA: Insufficient documentation

## 2021-04-28 DIAGNOSIS — R634 Abnormal weight loss: Secondary | ICD-10-CM | POA: Diagnosis not present

## 2021-04-28 DIAGNOSIS — K859 Acute pancreatitis without necrosis or infection, unspecified: Secondary | ICD-10-CM | POA: Diagnosis not present

## 2021-04-28 DIAGNOSIS — Z79899 Other long term (current) drug therapy: Secondary | ICD-10-CM | POA: Diagnosis not present

## 2021-04-28 DIAGNOSIS — N3281 Overactive bladder: Secondary | ICD-10-CM | POA: Insufficient documentation

## 2021-04-28 DIAGNOSIS — D709 Neutropenia, unspecified: Secondary | ICD-10-CM

## 2021-04-28 DIAGNOSIS — F4310 Post-traumatic stress disorder, unspecified: Secondary | ICD-10-CM | POA: Insufficient documentation

## 2021-04-28 DIAGNOSIS — K900 Celiac disease: Secondary | ICD-10-CM | POA: Diagnosis not present

## 2021-04-28 DIAGNOSIS — F32A Depression, unspecified: Secondary | ICD-10-CM | POA: Diagnosis not present

## 2021-04-28 DIAGNOSIS — D649 Anemia, unspecified: Secondary | ICD-10-CM | POA: Insufficient documentation

## 2021-04-28 LAB — CBC WITH DIFFERENTIAL (CANCER CENTER ONLY)
Abs Immature Granulocytes: 0 10*3/uL (ref 0.00–0.07)
Basophils Absolute: 0 10*3/uL (ref 0.0–0.1)
Basophils Relative: 1 %
Eosinophils Absolute: 0.4 10*3/uL (ref 0.0–0.5)
Eosinophils Relative: 14 %
HCT: 36 % (ref 36.0–46.0)
Hemoglobin: 12.1 g/dL (ref 12.0–15.0)
Immature Granulocytes: 0 %
Lymphocytes Relative: 43 %
Lymphs Abs: 1.4 10*3/uL (ref 0.7–4.0)
MCH: 30.9 pg (ref 26.0–34.0)
MCHC: 33.6 g/dL (ref 30.0–36.0)
MCV: 91.8 fL (ref 80.0–100.0)
Monocytes Absolute: 0.3 10*3/uL (ref 0.1–1.0)
Monocytes Relative: 9 %
Neutro Abs: 1.1 10*3/uL — ABNORMAL LOW (ref 1.7–7.7)
Neutrophils Relative %: 33 %
Platelet Count: 237 10*3/uL (ref 150–400)
RBC: 3.92 MIL/uL (ref 3.87–5.11)
RDW: 12 % (ref 11.5–15.5)
WBC Count: 3.2 10*3/uL — ABNORMAL LOW (ref 4.0–10.5)
nRBC: 0 % (ref 0.0–0.2)

## 2021-04-28 LAB — CMP (CANCER CENTER ONLY)
ALT: 18 U/L (ref 0–44)
AST: 22 U/L (ref 15–41)
Albumin: 4.1 g/dL (ref 3.5–5.0)
Alkaline Phosphatase: 58 U/L (ref 38–126)
Anion gap: 7 (ref 5–15)
BUN: 13 mg/dL (ref 6–20)
CO2: 27 mmol/L (ref 22–32)
Calcium: 9.1 mg/dL (ref 8.9–10.3)
Chloride: 104 mmol/L (ref 98–111)
Creatinine: 0.95 mg/dL (ref 0.44–1.00)
GFR, Estimated: >60 mL/min (ref >60)
Glucose, Bld: 85 mg/dL (ref 70–99)
Potassium: 3.9 mmol/L (ref 3.5–5.1)
Sodium: 138 mmol/L (ref 135–145)
Total Bilirubin: 0.5 mg/dL (ref 0.3–1.2)
Total Protein: 7.1 g/dL (ref 6.5–8.1)

## 2021-04-28 NOTE — Progress Notes (Signed)
HEMATOLOGY/ONCOLOGY CONSULTATION NOTE  Date of Service: 05/04/2021  Patient Care Team: Windell Hummingbird, PA-C as PCP - General (Physician Assistant)  CHIEF COMPLAINTS/PURPOSE OF CONSULTATION:  Chronic Leukopenia  HISTORY OF PRESENTING ILLNESS:   Savannah Clark is a wonderful 60 y.o. female who has been referred to [place] by Dr. Simona Huh for evaluation and management of leukopenia. The pt reports that she is doing well overall.   The pt reports that her last labs were taken as a part of routine process and not due to any symptoms. Pt denies frequent infections or any recent infections. Pt has a propensity to anemia and has recently restarted taking a prenatal vitamin. She also has a history of Vitamin D deficiency. Pt is not currently taking Naproxen or any other OTC pain medications. She discontinued taking B-complex vitamins several months ago. In the last year she has been started on Ozempic, Prozac, Zofran, Etodolac, & Oxycodone. Pt was started on Etodolac about 5 months ago, but uses them infrequently after receiving her steroid injections for her back pain. Pt last took 1/2 Oxycodone two weeks ago, which is also used to control her back pain. Pt has lost 40-65 lbs in the last 6 months due to Ozempic, which lowers her appetite. She has reached a weight loss plateau.   Pt has been able to maintain a mostly gluten free diet after finding out her Celiac disease. Pt experiences pancreatitis and an overactive bladder when her gluten allergy is triggered. Pt has never been diagnosed with a thyroid disorder. Pt has bilateral swelling in her neck every few years that is thought to be caused by recurrent infectious mononucleosis. She also has eczema, which is treated with creams and Hydroxyzine.  Most recent lab results (09/06/2019) of CBC is as follows: all values are WNL except for MCHC at 30.7, MPV at 10.7, Eos Rel at 5.1. 04/26/2019 WBC at 3.1K  On review of systems, pt reports weight loss, low  appetite and denies fevers, chills, night sweats, abdominal pain and any other symptoms.   On PMHx the pt reports Celiac disease, Pancreatitis, Mononucleosis, Diabetes, Anemia, Vitamin D deficiency, Eczema. On Social Hx the pt reports that she is non smoker and does not drink much alcohol. Pt denies any other drug use.   INTERVAL HISTORY:  Savannah Clark is a wonderful 60 y.o. female who is here for evaluation and management of leukopenia. The patient's last visit with [place] was on 04/26/2020.  Patient notes that she has had Covid and flu x 3 times over the last several months with the last time she had COVID being about 3 months ago. She notes that she was also diagnosed with Helicobacter pylori after she had upper GI discomfort.  Diagnosis was based on serology and she was prescribed treatment for H.pylori but she could not tolerate this and only took it for a couple of days. She was recommended to call her primary care physician regarding this so they can come up with a plan to treat her Helicobacter pylori.  She notes no fevers chills night sweats.  No new bone pains.  No sudden weight loss.  Lab results 04/28/2021 CBC shows normal hemoglobin of 12.1 with a normal platelet count of 237k and WBC count of 3.2k with an ANC of 1100. CMP within normal limits Myeloma panel shows no monoclonal protein.  On review of systems, pt reports some chronic fatigue but no other acute new symptoms. Continues to be on Dupixent for her eczema which she  notes is well controlled with the medication.  MEDICAL HISTORY:  Past Medical History:  Diagnosis Date   Anemia    Anxiety    Asthma    Bronchitis    Celiac disease    Depression    Night terror disorder    Pancreatitis, chronic (HCC)    PTSD (post-traumatic stress disorder)    Renal disorder    mild kidney disease    SURGICAL HISTORY: Past Surgical History:  Procedure Laterality Date   FOOT SURGERY      SOCIAL HISTORY: Social History    Socioeconomic History   Marital status: Single    Spouse name: Not on file   Number of children: Not on file   Years of education: Not on file   Highest education level: Not on file  Occupational History   Not on file  Tobacco Use   Smoking status: Former    Types: Cigarettes    Quit date: 06/09/2007    Years since quitting: 13.9   Smokeless tobacco: Never  Substance and Sexual Activity   Alcohol use: Yes    Comment: rare   Drug use: No   Sexual activity: Not on file  Other Topics Concern   Not on file  Social History Narrative   Not on file   Social Determinants of Health   Financial Resource Strain: Not on file  Food Insecurity: Not on file  Transportation Needs: Not on file  Physical Activity: Not on file  Stress: Not on file  Social Connections: Not on file  Intimate Partner Violence: Not on file    FAMILY HISTORY: No family history on file.  ALLERGIES:  is allergic to adhesive [tape], amoxicillin-pot clavulanate, aspirin, farxiga [dapagliflozin], gluten meal, januvia [sitagliptin], lactose intolerance (gi), and wheat.  MEDICATIONS:  Current Outpatient Medications  Medication Sig Dispense Refill   albuterol (PROVENTIL HFA;VENTOLIN HFA) 108 (90 BASE) MCG/ACT inhaler Inhale 2 puffs into the lungs every 6 (six) hours as needed. wheezing      Azelastine HCl 137 MCG/SPRAY SOLN SMARTSIG:1-2 Spray(s) Both Nares Twice Daily     Biotin 5000 MCG CAPS Take by mouth.     buPROPion (WELLBUTRIN XL) 300 MG 24 hr tablet Take 1 tablet (300 mg total) by mouth every morning. 30 tablet 3   CALCIUM PO Take 1 tablet by mouth daily.     cetirizine (ZYRTEC) 10 MG tablet Take 10 mg by mouth daily.     Dupilumab (DUPIXENT) 300 MG/2ML SOPN Inject into the skin.     FLUoxetine (PROZAC) 10 MG capsule Take 1 capsule (10 mg total) by mouth daily. 30 capsule 3   fluticasone (FLONASE) 50 MCG/ACT nasal spray Place 1 spray into both nostrils daily for 5 doses. 9.9 g 0   Fluticasone-Salmeterol  (ADVAIR) 250-50 MCG/DOSE AEPB Inhale 1 puff into the lungs 2 (two) times daily.     hydrOXYzine (ATARAX/VISTARIL) 25 MG tablet Take 1 tablet (25 mg total) by mouth 3 (three) times daily as needed. 90 tablet 3   magnesium 30 MG tablet Take 30 mg by mouth 2 (two) times daily.     ondansetron (ZOFRAN ODT) 8 MG disintegrating tablet 37m ODT q8  hours prn nausea 6 tablet 0   Semaglutide, 1 MG/DOSE, (OZEMPIC, 1 MG/DOSE,) 2 MG/1.5ML SOPN Ozempic 1 mg/dose (2 mg/1.5 mL) subcutaneous pen injector  INJECT 1 MG SUBCUTANEOUSLY ONCE A WEEK     No current facility-administered medications for this visit.    REVIEW OF SYSTEMS:   .10 Point  review of Systems was done is negative except as noted above.   PHYSICAL EXAMINATION: ECOG PERFORMANCE STATUS: 0 - Asymptomatic  .BP (!) 128/94 (BP Location: Left Arm, Patient Position: Sitting)   Pulse 81   Temp 97.9 F (36.6 C) (Temporal)   Resp 16   Ht 5' 7"  (1.702 m)   Wt 140 lb 3.2 oz (63.6 kg)   SpO2 100%   BMI 21.96 kg/m  . GENERAL:alert, in no acute distress and comfortable SKIN: no acute rashes, no significant lesions EYES: conjunctiva are pink and non-injected, sclera anicteric OROPHARYNX: MMM, no exudates, no oropharyngeal erythema or ulceration NECK: supple, no JVD LYMPH:  no palpable lymphadenopathy in the cervical, axillary or inguinal regions LUNGS: clear to auscultation b/l with normal respiratory effort HEART: regular rate & rhythm ABDOMEN:  normoactive bowel sounds , non tender, not distended. Extremity: no pedal edema PSYCH: alert & oriented x 3 with fluent speech NEURO: no focal motor/sensory deficits   LABORATORY DATA:  I have reviewed the data as listed  . CBC Latest Ref Rng & Units 04/28/2021 04/11/2020 04/11/2020  WBC 4.0 - 10.5 K/uL 3.2(L) 3.1(L) -  Hemoglobin 12.0 - 15.0 g/dL 12.1 12.9 -  Hematocrit 36.0 - 46.0 % 36.0 38.8 39.2  Platelets 150 - 400 K/uL 237 257 -   ANC 1100  . CMP Latest Ref Rng & Units 04/28/2021  04/11/2020 04/22/2011  Glucose 70 - 99 mg/dL 85 78 86  BUN 6 - 20 mg/dL 13 9 16   Creatinine 0.44 - 1.00 mg/dL 0.95 1.11(H) 1.00  Sodium 135 - 145 mmol/L 138 140 141  Potassium 3.5 - 5.1 mmol/L 3.9 4.0 3.8  Chloride 98 - 111 mmol/L 104 103 107  CO2 22 - 32 mmol/L 27 29 -  Calcium 8.9 - 10.3 mg/dL 9.1 9.6 -  Total Protein 6.5 - 8.1 g/dL 7.1 7.8 -  Total Bilirubin 0.3 - 1.2 mg/dL 0.5 0.7 -  Alkaline Phos 38 - 126 U/L 58 65 -  AST 15 - 41 U/L 22 21 -  ALT 0 - 44 U/L 18 26 -     RADIOGRAPHIC STUDIES: I have personally reviewed the radiological images as listed and agreed with the findings in the report. No results found.  ASSESSMENT & PLAN:   60 yo with  1) Chronic Leukopenia  with mild neutropenia (WBC 3.2k/ANC 1100) PLAN: -Discussed pt labwork,  Lab results 04/28/2021 CBC shows normal hemoglobin of 12.1 with a normal platelet count of 237k and WBC count of 3.2k with an ANC of 1100. CMP within normal limits Myeloma panel shows no monoclonal protein. -We discussed that her neutropenia is in the context of using Dupixent, antibiotics for Helicobacter pylori and multiple recurrent COVID-19 and other viral infections which could also cause this. -We discussed that this could also be benign ethnic leukopenia/neutropenia. -Since she has developed some neutropenia we would want to trend out her labs over the next 4 months. -If her WBC count or neutrophil counts continue to drop or any other cytopenias arise would likely need to pursue a bone marrow biopsy. -She was recommended to follow-up with her primary care physician for guidance regarding completing her H. pylori treatment. -She was recommended to continue vitamin B complex 1 capsule p.o. daily.  FOLLOW UP: RTC with Dr Irene Limbo with labs in 4 months  . The total time spent in the appointment was 20 minutes and more than 50% was on counseling and direct patient cares.  All of the patient's questions were  answered with apparent  satisfaction. The patient knows to call the clinic with any problems, questions or concerns.    Sullivan Lone MD [place]e AAHIVMS [facility] Hematology/Oncology Physician [facility]

## 2021-05-01 LAB — MULTIPLE MYELOMA PANEL, SERUM
Albumin SerPl Elph-Mcnc: 4 g/dL (ref 2.9–4.4)
Albumin/Glob SerPl: 1.5 (ref 0.7–1.7)
Alpha 1: 0.2 g/dL (ref 0.0–0.4)
Alpha2 Glob SerPl Elph-Mcnc: 0.5 g/dL (ref 0.4–1.0)
B-Globulin SerPl Elph-Mcnc: 0.9 g/dL (ref 0.7–1.3)
Gamma Glob SerPl Elph-Mcnc: 1.1 g/dL (ref 0.4–1.8)
Globulin, Total: 2.8 g/dL (ref 2.2–3.9)
IgA: 309 mg/dL (ref 87–352)
IgG (Immunoglobin G), Serum: 1087 mg/dL (ref 586–1602)
IgM (Immunoglobulin M), Srm: 232 mg/dL — ABNORMAL HIGH (ref 26–217)
Total Protein ELP: 6.8 g/dL (ref 6.0–8.5)

## 2021-06-20 ENCOUNTER — Other Ambulatory Visit (HOSPITAL_COMMUNITY): Payer: Self-pay | Admitting: Psychiatry

## 2021-06-20 DIAGNOSIS — F411 Generalized anxiety disorder: Secondary | ICD-10-CM

## 2021-08-25 ENCOUNTER — Other Ambulatory Visit: Payer: Self-pay

## 2021-08-25 DIAGNOSIS — D72819 Decreased white blood cell count, unspecified: Secondary | ICD-10-CM

## 2021-08-26 ENCOUNTER — Inpatient Hospital Stay: Payer: Medicaid Other

## 2021-08-26 ENCOUNTER — Inpatient Hospital Stay: Payer: Medicaid Other | Admitting: Hematology

## 2021-09-09 ENCOUNTER — Other Ambulatory Visit: Payer: Self-pay

## 2021-09-09 ENCOUNTER — Inpatient Hospital Stay: Payer: Medicaid Other | Attending: Hematology

## 2021-09-09 ENCOUNTER — Inpatient Hospital Stay: Payer: Medicaid Other | Admitting: Hematology

## 2021-09-09 VITALS — BP 132/86 | HR 73 | Temp 97.7°F | Resp 20 | Wt 142.7 lb

## 2021-09-09 DIAGNOSIS — R634 Abnormal weight loss: Secondary | ICD-10-CM | POA: Insufficient documentation

## 2021-09-09 DIAGNOSIS — Z888 Allergy status to other drugs, medicaments and biological substances status: Secondary | ICD-10-CM | POA: Diagnosis not present

## 2021-09-09 DIAGNOSIS — Z880 Allergy status to penicillin: Secondary | ICD-10-CM | POA: Insufficient documentation

## 2021-09-09 DIAGNOSIS — Z681 Body mass index (BMI) 19 or less, adult: Secondary | ICD-10-CM | POA: Diagnosis not present

## 2021-09-09 DIAGNOSIS — E119 Type 2 diabetes mellitus without complications: Secondary | ICD-10-CM | POA: Insufficient documentation

## 2021-09-09 DIAGNOSIS — E559 Vitamin D deficiency, unspecified: Secondary | ICD-10-CM | POA: Insufficient documentation

## 2021-09-09 DIAGNOSIS — D649 Anemia, unspecified: Secondary | ICD-10-CM | POA: Diagnosis present

## 2021-09-09 DIAGNOSIS — N644 Mastodynia: Secondary | ICD-10-CM | POA: Diagnosis not present

## 2021-09-09 DIAGNOSIS — Z79899 Other long term (current) drug therapy: Secondary | ICD-10-CM | POA: Insufficient documentation

## 2021-09-09 DIAGNOSIS — R21 Rash and other nonspecific skin eruption: Secondary | ICD-10-CM | POA: Diagnosis not present

## 2021-09-09 DIAGNOSIS — N3281 Overactive bladder: Secondary | ICD-10-CM | POA: Insufficient documentation

## 2021-09-09 DIAGNOSIS — D72819 Decreased white blood cell count, unspecified: Secondary | ICD-10-CM | POA: Diagnosis not present

## 2021-09-09 DIAGNOSIS — F419 Anxiety disorder, unspecified: Secondary | ICD-10-CM | POA: Insufficient documentation

## 2021-09-09 DIAGNOSIS — D72818 Other decreased white blood cell count: Secondary | ICD-10-CM | POA: Diagnosis present

## 2021-09-09 DIAGNOSIS — D709 Neutropenia, unspecified: Secondary | ICD-10-CM

## 2021-09-09 DIAGNOSIS — R0982 Postnasal drip: Secondary | ICD-10-CM | POA: Insufficient documentation

## 2021-09-09 DIAGNOSIS — Z886 Allergy status to analgesic agent status: Secondary | ICD-10-CM | POA: Insufficient documentation

## 2021-09-09 DIAGNOSIS — K859 Acute pancreatitis without necrosis or infection, unspecified: Secondary | ICD-10-CM | POA: Diagnosis not present

## 2021-09-09 DIAGNOSIS — K900 Celiac disease: Secondary | ICD-10-CM | POA: Diagnosis not present

## 2021-09-09 LAB — CBC WITH DIFFERENTIAL (CANCER CENTER ONLY)
Abs Immature Granulocytes: 0 10*3/uL (ref 0.00–0.07)
Basophils Absolute: 0 10*3/uL (ref 0.0–0.1)
Basophils Relative: 1 %
Eosinophils Absolute: 0.1 10*3/uL (ref 0.0–0.5)
Eosinophils Relative: 4 %
HCT: 35.7 % — ABNORMAL LOW (ref 36.0–46.0)
Hemoglobin: 11.9 g/dL — ABNORMAL LOW (ref 12.0–15.0)
Immature Granulocytes: 0 %
Lymphocytes Relative: 52 %
Lymphs Abs: 1.8 10*3/uL (ref 0.7–4.0)
MCH: 30.6 pg (ref 26.0–34.0)
MCHC: 33.3 g/dL (ref 30.0–36.0)
MCV: 91.8 fL (ref 80.0–100.0)
Monocytes Absolute: 0.3 10*3/uL (ref 0.1–1.0)
Monocytes Relative: 9 %
Neutro Abs: 1.2 10*3/uL — ABNORMAL LOW (ref 1.7–7.7)
Neutrophils Relative %: 34 %
Platelet Count: 244 10*3/uL (ref 150–400)
RBC: 3.89 MIL/uL (ref 3.87–5.11)
RDW: 12 % (ref 11.5–15.5)
WBC Count: 3.4 10*3/uL — ABNORMAL LOW (ref 4.0–10.5)
nRBC: 0 % (ref 0.0–0.2)

## 2021-09-09 LAB — CMP (CANCER CENTER ONLY)
ALT: 19 U/L (ref 0–44)
AST: 22 U/L (ref 15–41)
Albumin: 4.1 g/dL (ref 3.5–5.0)
Alkaline Phosphatase: 53 U/L (ref 38–126)
Anion gap: 6 (ref 5–15)
BUN: 13 mg/dL (ref 8–23)
CO2: 28 mmol/L (ref 22–32)
Calcium: 9.5 mg/dL (ref 8.9–10.3)
Chloride: 105 mmol/L (ref 98–111)
Creatinine: 1.02 mg/dL — ABNORMAL HIGH (ref 0.44–1.00)
GFR, Estimated: >60 mL/min (ref >60)
Glucose, Bld: 84 mg/dL (ref 70–99)
Potassium: 4 mmol/L (ref 3.5–5.1)
Sodium: 139 mmol/L (ref 135–145)
Total Bilirubin: 0.4 mg/dL (ref 0.3–1.2)
Total Protein: 7.1 g/dL (ref 6.5–8.1)

## 2021-09-09 NOTE — Progress Notes (Signed)
? ? ?HEMATOLOGY/ONCOLOGY CLINIC NOTE ? ?Date of Service: 09/09/2021 ? ?Patient Care Team: ?Savannah Clark, Savannah Clark as PCP - General (Physician Assistant) ? ?CHIEF COMPLAINTS/PURPOSE OF CONSULTATION:  ?Chronic Leukopenia ? ?HISTORY OF PRESENTING ILLNESS:  ? ?Savannah Clark is a wonderful 61 y.o. female who has been referred to [place] by Dr. Courtney Paris for evaluation and management of leukopenia. The pt reports that she is doing well overall.  ? ?The pt reports that her last labs were taken as a part of routine process and not due to any symptoms. Pt denies frequent infections or any recent infections. Pt has a propensity to anemia and has recently restarted taking a prenatal vitamin. She also has a history of Vitamin D deficiency. Pt is not currently taking Naproxen or any other OTC pain medications. She discontinued taking B-complex vitamins several months ago. In the last year she has been started on Ozempic, Prozac, Zofran, Etodolac, & Oxycodone. Pt was started on Etodolac about 5 months ago, but uses them infrequently after receiving her steroid injections for her back pain. Pt last took 1/2 Oxycodone two weeks ago, which is also used to control her back pain. Pt has lost 40-65 lbs in the last 6 months due to Ozempic, which lowers her appetite. She has reached a weight loss plateau.  ? ?Pt has been able to maintain a mostly gluten free diet after finding out her Celiac disease. Pt experiences pancreatitis and an overactive bladder when her gluten allergy is triggered. Pt has never been diagnosed with a thyroid disorder. Pt has bilateral swelling in her neck every few years that is thought to be caused by recurrent infectious mononucleosis. She also has eczema, which is treated with creams and Hydroxyzine. ? ?Most recent lab results (09/06/2019) of CBC is as follows: all values are WNL except for MCHC at 30.7, MPV at 10.7, Eos Rel at 5.1. ?04/26/2019 WBC at 3.1K ? ?On review of systems, pt reports weight loss, low  appetite and denies fevers, chills, night sweats, abdominal pain and any other symptoms.  ? ?On PMHx the pt reports Celiac disease, Pancreatitis, Mononucleosis, Diabetes, Anemia, Vitamin D deficiency, Eczema. ?On Social Hx the pt reports that she is non smoker and does not drink much alcohol. Pt denies any other drug use.  ? ?INTERVAL HISTORY: ? ?Savannah Clark is a wonderful 61 y.o. female who is here for evaluation and management of leukopenia. The patient's last visit with [place] was on 04/26/2020.  ? ?She notes her allergies have been "hell on  ?Earth." She reports post nasal drip which she describes as "hell." She says she has been having Itchy skin and a rash. She takes Dupixent and has taken it for a few years and she notes that it helped at first but is not currently helping.  ? ?She reports that she no longer takes Ozempic and now takes [place]. ? ?She reports that she has silicone implants that ruptured about 2 years ago. She notes that there is confirmed calcification surrounding the implant. She expresses that she has been experiencing a lot of pain. We discussed the potential risk and complications of leaving the silicone in her body and financial options in relation to paying for the surgery to correcting the issue. We discussed potentially getting a second opinion at [facility]. ? ?No new focal bone pains. ?No fever, chills, night sweats. ?No new lumps, bumps, or lesions/rashes. ?No abdominal pain or change in bowel habits. ?No new or unexpected weight loss. ?No other  new or acute focal symptoms. ? ?Lab results today CBC shows normal hemoglobin of 11.9 with a normal platelet count of 244k and WBC count of 3.4k with an ANC of 1200. ?CMP within normal limits ? ?Continues to be on Dupixent for her eczema which she notes is well controlled with the medication. ? ?MEDICAL HISTORY:  ?Past Medical History:  ?Diagnosis Date  ? Anemia   ? Anxiety   ? Asthma   ? Bronchitis   ? Celiac disease   ?  Depression   ? Night terror disorder   ? Pancreatitis, chronic (HCC)   ? PTSD (post-traumatic stress disorder)   ? Renal disorder   ? mild kidney disease  ? ? ?SURGICAL HISTORY: ?Past Surgical History:  ?Procedure Laterality Date  ? FOOT SURGERY    ? ? ?SOCIAL HISTORY: ?Social History  ? ?Socioeconomic History  ? Marital status: Single  ?  Spouse name: Not on file  ? Number of children: Not on file  ? Years of education: Not on file  ? Highest education level: Not on file  ?Occupational History  ? Not on file  ?Tobacco Use  ? Smoking status: Former  ?  Types: Cigarettes  ?  Quit date: 06/09/2007  ?  Years since quitting: 14.2  ? Smokeless tobacco: Never  ?Substance and Sexual Activity  ? Alcohol use: Yes  ?  Comment: rare  ? Drug use: No  ? Sexual activity: Not on file  ?Other Topics Concern  ? Not on file  ?Social History Narrative  ? Not on file  ? ?Social Determinants of Health  ? ?Financial Resource Strain: Not on file  ?Food Insecurity: Not on file  ?Transportation Needs: Not on file  ?Physical Activity: Not on file  ?Stress: Not on file  ?Social Connections: Not on file  ?Intimate Partner Violence: Not on file  ? ? ?FAMILY HISTORY: ?No family history on file. ? ?ALLERGIES:  is allergic to adhesive [tape], amoxicillin-pot clavulanate, aspirin, farxiga [dapagliflozin], gluten meal, januvia [sitagliptin], lactose intolerance (gi), and wheat. ? ?MEDICATIONS:  ?Current Outpatient Medications  ?Medication Sig Dispense Refill  ? albuterol (PROVENTIL HFA;VENTOLIN HFA) 108 (90 BASE) MCG/ACT inhaler Inhale 2 puffs into the lungs every 6 (six) hours as needed. wheezing     ? Azelastine HCl 137 MCG/SPRAY SOLN SMARTSIG:1-2 Spray(s) Both Nares Twice Daily    ? Biotin 5000 MCG CAPS Take by mouth.    ? buPROPion (WELLBUTRIN XL) 300 MG 24 hr tablet TAKE 1 TABLET (300 MG TOTAL) BY MOUTH EVERY MORNING. 90 tablet 2  ? CALCIUM PO Take 1 tablet by mouth daily.    ? cetirizine (ZYRTEC) 10 MG tablet Take 10 mg by mouth daily.    ?  Dupilumab (DUPIXENT) 300 MG/2ML SOPN Inject into the skin.    ? FLUoxetine (PROZAC) 10 MG capsule Take 1 capsule (10 mg total) by mouth daily. 30 capsule 3  ? fluticasone (FLONASE) 50 MCG/ACT nasal spray Place 1 spray into both nostrils daily for 5 doses. 9.9 g 0  ? Fluticasone-Salmeterol (ADVAIR) 250-50 MCG/DOSE AEPB Inhale 1 puff into the lungs 2 (two) times daily.    ? hydrOXYzine (ATARAX/VISTARIL) 25 MG tablet Take 1 tablet (25 mg total) by mouth 3 (three) times daily as needed. 90 tablet 3  ? magnesium 30 MG tablet Take 30 mg by mouth 2 (two) times daily.    ? ondansetron (ZOFRAN ODT) 8 MG disintegrating tablet  ODT q8  hours prn nausea 6 tablet 0  ?  Semaglutide, 1 MG/DOSE, (OZEMPIC, 1 MG/DOSE,) 2 MG/1.5ML SOPN Ozempic 1 mg/dose (2 mg/1.5 mL) subcutaneous pen injector ? INJECT 1 MG SUBCUTANEOUSLY ONCE A WEEK    ? ?No current facility-administered medications for this visit.  ? ? ?REVIEW OF SYSTEMS:   ?.10 Point review of Systems was done is negative except as noted above. ? ? ?PHYSICAL EXAMINATION: ?ECOG PERFORMANCE STATUS: 0 - Asymptomatic ? ?.BP 132/86   Pulse 73   Temp 97.7 ?F (36.5 ?C)   Resp 20   Wt 142 lb 11.2 oz (64.7 kg)   SpO2 100%   BMI 22.35 kg/m?  ?NAD ?GENERAL:alert, in no acute distress and comfortable ?SKIN: no acute rashes, no significant lesions ?EYES: conjunctiva are pink and non-injected, sclera anicteric ?NECK: supple, no JVD ?LYMPH:  no palpable lymphadenopathy in the cervical, axillary or inguinal regions ?LUNGS: clear to auscultation b/l with normal respiratory effort ?HEART: regular rate & rhythm ?ABDOMEN:  normoactive bowel sounds , non tender, not distended. ?Extremity: no pedal edema ?PSYCH: alert & oriented x 3 with fluent speech ?NEURO: no focal motor/sensory deficits ? ?LABORATORY DATA:  ?I have reviewed the data as listed ? ?. ? ?  Latest Ref Rng & Units 09/09/2021  ? 10:35 AM 04/28/2021  ? 10:20 AM 04/11/2020  ? 12:22 PM  ?CBC  ?WBC 4.0 - 10.5 K/uL 3.4   3.2     ?Hemoglobin  12.0 - 15.0 g/dL 40.9   81.1     ?Hematocrit 36.0 - 46.0 % 35.7   36.0   38.8    ?Platelets 150 - 400 K/uL 244   237     ? ?ANC 1100 ? ?. ? ?  Latest Ref Rng & Units 09/09/2021  ? 10:35 AM 04/28/2021  ? 10:20 AM 04/11/2020  ?

## 2021-09-12 LAB — MULTIPLE MYELOMA PANEL, SERUM
Albumin SerPl Elph-Mcnc: 3.9 g/dL (ref 2.9–4.4)
Albumin/Glob SerPl: 1.5 (ref 0.7–1.7)
Alpha 1: 0.2 g/dL (ref 0.0–0.4)
Alpha2 Glob SerPl Elph-Mcnc: 0.5 g/dL (ref 0.4–1.0)
B-Globulin SerPl Elph-Mcnc: 0.9 g/dL (ref 0.7–1.3)
Gamma Glob SerPl Elph-Mcnc: 1 g/dL (ref 0.4–1.8)
Globulin, Total: 2.7 g/dL (ref 2.2–3.9)
IgA: 285 mg/dL (ref 87–352)
IgG (Immunoglobin G), Serum: 995 mg/dL (ref 586–1602)
IgM (Immunoglobulin M), Srm: 219 mg/dL — ABNORMAL HIGH (ref 26–217)
Total Protein ELP: 6.6 g/dL (ref 6.0–8.5)

## 2021-10-07 ENCOUNTER — Other Ambulatory Visit (HOSPITAL_COMMUNITY): Payer: Self-pay | Admitting: Psychiatry

## 2021-10-07 DIAGNOSIS — F411 Generalized anxiety disorder: Secondary | ICD-10-CM

## 2021-10-09 NOTE — Progress Notes (Signed)
Referral faxed to [facility] per Dr Kale.  

## 2021-11-05 ENCOUNTER — Other Ambulatory Visit: Payer: Self-pay

## 2021-11-05 ENCOUNTER — Other Ambulatory Visit: Payer: Self-pay | Admitting: Hematology

## 2021-11-05 DIAGNOSIS — T8543XA Leakage of breast prosthesis and implant, initial encounter: Secondary | ICD-10-CM

## 2021-11-05 DIAGNOSIS — N644 Mastodynia: Secondary | ICD-10-CM

## 2021-11-05 NOTE — Progress Notes (Signed)
Given referral to plastic surgery on recommendation from general surgery clinic

## 2021-12-04 ENCOUNTER — Ambulatory Visit: Payer: Medicaid Other | Admitting: Plastic Surgery

## 2021-12-04 ENCOUNTER — Encounter: Payer: Self-pay | Admitting: Plastic Surgery

## 2021-12-04 VITALS — BP 106/74 | HR 117 | Ht 67.0 in | Wt 133.6 lb

## 2021-12-04 DIAGNOSIS — T8543XA Leakage of breast prosthesis and implant, initial encounter: Secondary | ICD-10-CM

## 2021-12-04 NOTE — Progress Notes (Signed)
Referring Provider [name], PA-C [address] [place],  [place] [address]   CC:  Chief Complaint  Patient presents with   Advice Only      Savannah Clark is an 61 y.o. female.  HPI: Patient presents to discuss removal of implants.  She had implants placed in the 90s for cosmetic reasons.  These were submuscular gel implants.  Over the past few years she has noticed significant change in the shape and has some pain on both sides as well.  Recent MRI showed intra and extracapsular rupture of bilateral gel implants.  She is interested in having them removed.  She is a type II diabetic but well controlled.  She is a former smoker.  Allergies  Allergen Reactions   Adhesive [Tape] Hives and Other (See Comments)    Blisters    Amoxicillin-Pot Clavulanate Nausea And Vomiting   Aspirin Other (See Comments)    Gi upset   Farxiga [Dapagliflozin]    Gluten Meal    Januvia [Sitagliptin]    Lactose Intolerance (Gi)    Wheat Diarrhea, Nausea Only and Other (See Comments)    Joints pain    Outpatient Encounter Medications as of 12/04/2021  Medication Sig   acetaminophen-codeine (TYLENOL #3) 300-30 MG tablet Take 1 tablet by mouth 3 (three) times daily as needed.   albuterol (PROVENTIL HFA;VENTOLIN HFA) 108 (90 BASE) MCG/ACT inhaler Inhale 2 puffs into the lungs every 6 (six) hours as needed. wheezing    aspirin EC 81 MG tablet aspirin 81 mg tablet,delayed release  1 (ONE) TABLET DAILY W/FOOD X1 MONTH   Azelastine HCl 137 MCG/SPRAY SOLN SMARTSIG:1-2 Spray(s) Both Nares Twice Daily   Biotin 5000 MCG CAPS Take by mouth.   buPROPion (WELLBUTRIN XL) 300 MG 24 hr tablet TAKE 1 TABLET (300 MG TOTAL) BY MOUTH EVERY MORNING.   buPROPion (WELLBUTRIN XL) 300 MG 24 hr tablet Take by mouth.   CALCIUM PO Take 1 tablet by mouth daily.   cetirizine (ZYRTEC) 10 MG tablet Take 10 mg by mouth daily.   clobetasol ointment (TEMOVATE) 0.05 % clobetasol 0.05 % topical ointment  APPLY TO WORST AREAS OF  SKIN/RASH/ITCHING TWICE DAILY AS NEEDED.   cyclobenzaprine (FLEXERIL) 10 MG tablet Take 10 mg by mouth 3 (three) times daily as needed.   Dupilumab (DUPIXENT) 300 MG/2ML SOPN Inject into the skin.   etodolac (LODINE XL) 400 MG 24 hr tablet Take 400 mg by mouth daily.   FLUoxetine (PROZAC) 10 MG capsule Take 1 capsule (10 mg total) by mouth daily.   Fluoxetine HCl, PMDD, 10 MG TABS Take by mouth.   fluticasone (FLONASE) 50 MCG/ACT nasal spray fluticasone propionate 50 mcg/actuation nasal spray,suspension  INHALE 2 (TWO) SPRAY BY NOSE AT BEDTIME   Fluticasone-Salmeterol (ADVAIR) 250-50 MCG/DOSE AEPB Inhale 1 puff into the lungs 2 (two) times daily.   HYDROcodone-acetaminophen (NORCO/VICODIN) 5-325 MG tablet hydrocodone 5 mg-acetaminophen 325 mg tablet  TAKE 1 (ONE) TABLET FOUR TIMES DAILY, AS NEEDED FOR PAIN   hydrocortisone 2.5 % cream hydrocortisone 2.5 % topical cream  APPLY TO AFFECTED AREA OF THE FACE NECK GROIN TWICE DAILY AS NEEDED FOR ITCHING   hydroquinone 4 % cream hydroquinone 4 % topical cream  APPLY TOPICALLY 2 TIMES DAILY. NIGHTLY TO DARK SPOTS ONLY.   hydrOXYzine (ATARAX) 50 MG tablet hydroxyzine HCl 50 mg tablet   hydrOXYzine (ATARAX/VISTARIL) 25 MG tablet Take 1 tablet (25 mg total) by mouth 3 (three) times daily as needed.   magnesium 30  MG tablet Take 30 mg by mouth 2 (two) times daily.   MOUNJARO 10 MG/0.5ML Pen SMARTSIG:10 Milligram(s) SUB-Q Once a Week   ondansetron (ZOFRAN ODT) 8 MG disintegrating tablet 8mg  ODT q8  hours prn nausea   polyethylene glycol powder (GLYCOLAX/MIRALAX) 17 GM/SCOOP powder polyethylene glycol 3350 17 gram/dose oral powder  TAKE 1 CAPFUL BY MOUTH AS DIRECTED   Prenatal Vit-Fe Fumarate-FA (M-NATAL PLUS) 27-1 MG TABS Take 1 tablet by mouth daily.   promethazine (PHENERGAN) 12.5 MG tablet Take by mouth.   tacrolimus (PROTOPIC) 0.1 % ointment Protopic 0.1 % topical ointment  APPLY TOPICALLY TO AFFECTED AREAS OF FACE DAILY AS NEEDED   traZODone  (DESYREL) 50 MG tablet Take 100 mg by mouth at bedtime as needed.   fluticasone (FLONASE) 50 MCG/ACT nasal spray Place 1 spray into both nostrils daily for 5 doses.   [DISCONTINUED] Semaglutide, 1 MG/DOSE, (OZEMPIC, 1 MG/DOSE,) 2 MG/1.5ML SOPN Ozempic 1 mg/dose (2 mg/1.5 mL) subcutaneous pen injector  INJECT 1 MG SUBCUTANEOUSLY ONCE A WEEK   No facility-administered encounter medications on file as of 12/04/2021.     Past Medical History:  Diagnosis Date   Anemia    Anxiety    Asthma    Bronchitis    Celiac disease    Depression    Night terror disorder    Pancreatitis, chronic (HCC)    PTSD (post-traumatic stress disorder)    Renal disorder    mild kidney disease    Past Surgical History:  Procedure Laterality Date   FOOT SURGERY      No family history on file.  Social History   Social History Narrative   Not on file     Review of Systems General: Denies fevers, chills, weight loss CV: Denies chest pain, shortness of breath, palpitations  Physical Exam    12/04/2021    9:32 AM 09/09/2021   11:47 AM 04/28/2021   10:57 AM  Vitals with BMI  Height 5\' 7"   5\' 7"   Weight 133 lbs 10 oz 142 lbs 11 oz 140 lbs 3 oz  BMI 20.92  21.95  Systolic 106 132 04/30/2021  Diastolic 74 86 94  Pulse 117 73 81    General:  No acute distress,  Alert and oriented, Non-Toxic, Normal speech and affect Breast: She has bilateral submuscular implants.  They are firm and tender giving her advanced capsular contracture.  Areola and breast tissue descent inferiorly off the implant volume.  She is reasonably symmetric.  No other scars apart from the inframammary incision.  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  We discussed removal of both implants with total capsulectomy.  We discussed risks include bleeding, infection, damage to surrounding structures need for additional procedures.  We discussed the need for  drains postoperatively.  We did discuss that the appearance would be a bit deflated.  She is not interested at this time and doing any cosmetic implant placement or lift but it is something that she might entertain down the line.  We did discuss what those options would look like for her.  All of her questions were answered.  12/04/2021, 11:40 AM

## 2021-12-11 ENCOUNTER — Telehealth: Payer: Self-pay | Admitting: Plastic Surgery

## 2021-12-11 NOTE — Telephone Encounter (Signed)
Called to schedule surgery with Dr. Arita Miss. Patient said she has just gotten an epidural injection and is in a lot of pain and would like to call me back to discuss.

## 2021-12-12 ENCOUNTER — Other Ambulatory Visit: Payer: Self-pay

## 2021-12-12 ENCOUNTER — Telehealth: Payer: Self-pay

## 2021-12-12 DIAGNOSIS — N644 Mastodynia: Secondary | ICD-10-CM

## 2021-12-12 DIAGNOSIS — T8543XA Leakage of breast prosthesis and implant, initial encounter: Secondary | ICD-10-CM

## 2021-12-12 NOTE — Telephone Encounter (Signed)
T/C from pt requesting a referral to [facility].  She saw the plastic surgeon at [facility] and her insurance/Medicaid will only cover the removal for the ruptured breast implants and the hard shell. Apparently they want payment up front for any remaining costs  After research, she said after insurance [facility] will do payment plans for any additional reconstructive surgery.  Per pt's request referral faxed and confirmation received.

## 2021-12-30 ENCOUNTER — Telehealth: Payer: Self-pay | Admitting: Surgical

## 2021-12-30 NOTE — Telephone Encounter (Signed)
Left message to return call to f/u on consult; need to shedule a consult with one of our other providers at [facility] to the patient.

## 2022-01-01 ENCOUNTER — Other Ambulatory Visit (HOSPITAL_COMMUNITY): Payer: Self-pay

## 2022-01-01 MED ORDER — MOUNJARO 10 MG/0.5ML ~~LOC~~ SOAJ
SUBCUTANEOUS | 3 refills | Status: AC
  Filled 2022-01-01 – 2022-11-16 (×3): qty 2, 28d supply, fill #0
  Filled ????-??-??: fill #0

## 2022-01-07 ENCOUNTER — Encounter (INDEPENDENT_AMBULATORY_CARE_PROVIDER_SITE_OTHER): Payer: Self-pay

## 2022-01-21 ENCOUNTER — Telehealth: Payer: Self-pay

## 2022-01-21 NOTE — Telephone Encounter (Signed)
Faxed request to change provider from Dr. Arita Miss to Dr. Domenica Reamer and extend eff 9/11-12/11 (date of office visit) under current authorization #[id number]

## 2022-01-27 ENCOUNTER — Telehealth: Payer: Self-pay

## 2022-01-27 ENCOUNTER — Other Ambulatory Visit (HOSPITAL_COMMUNITY): Payer: Self-pay | Admitting: Psychiatry

## 2022-01-27 DIAGNOSIS — F411 Generalized anxiety disorder: Secondary | ICD-10-CM

## 2022-01-27 NOTE — Telephone Encounter (Signed)
Refaxed 2nd request to change provider from Dr. Arita Miss to Dr. Domenica Reamer and extend eff 9/11-12/11 (date of office visit) under current authorization #[id number]. Amendment came in via fax with no changes.

## 2022-01-28 ENCOUNTER — Telehealth: Payer: Self-pay

## 2022-01-28 NOTE — Telephone Encounter (Signed)
Ref/Approval# [id number] [employer], provider was still not updated. Was verified provider was updated to Dr. Domenica Reamer for dates 9/11-11/10/23 Approved for [id number], [id number], [id number], [id number]

## 2022-01-28 NOTE — Telephone Encounter (Signed)
Approved for [facility]

## 2022-02-14 ENCOUNTER — Other Ambulatory Visit: Payer: Self-pay

## 2022-02-14 ENCOUNTER — Encounter (HOSPITAL_COMMUNITY): Payer: Self-pay

## 2022-02-14 ENCOUNTER — Emergency Department (HOSPITAL_COMMUNITY)
Admission: EM | Admit: 2022-02-14 | Discharge: 2022-02-14 | Disposition: A | Discharge status: Home / Self Care | Payer: Medicaid Other | Attending: Emergency Medicine | Admitting: Emergency Medicine

## 2022-02-14 DIAGNOSIS — R112 Nausea with vomiting, unspecified: Secondary | ICD-10-CM | POA: Insufficient documentation

## 2022-02-14 DIAGNOSIS — D72819 Decreased white blood cell count, unspecified: Secondary | ICD-10-CM | POA: Insufficient documentation

## 2022-02-14 DIAGNOSIS — R1012 Left upper quadrant pain: Secondary | ICD-10-CM | POA: Diagnosis present

## 2022-02-14 DIAGNOSIS — Z7951 Long term (current) use of inhaled steroids: Secondary | ICD-10-CM | POA: Diagnosis not present

## 2022-02-14 DIAGNOSIS — Z7982 Long term (current) use of aspirin: Secondary | ICD-10-CM | POA: Diagnosis not present

## 2022-02-14 DIAGNOSIS — R682 Dry mouth, unspecified: Secondary | ICD-10-CM | POA: Insufficient documentation

## 2022-02-14 DIAGNOSIS — J45909 Unspecified asthma, uncomplicated: Secondary | ICD-10-CM | POA: Insufficient documentation

## 2022-02-14 LAB — COMPREHENSIVE METABOLIC PANEL
ALT: 18 U/L (ref 0–44)
AST: 24 U/L (ref 15–41)
Albumin: 4.4 g/dL (ref 3.5–5.0)
Alkaline Phosphatase: 48 U/L (ref 38–126)
Anion gap: 13 (ref 5–15)
BUN: 10 mg/dL (ref 8–23)
CO2: 24 mmol/L (ref 22–32)
Calcium: 9.8 mg/dL (ref 8.9–10.3)
Chloride: 101 mmol/L (ref 98–111)
Creatinine, Ser: 0.99 mg/dL (ref 0.44–1.00)
GFR, Estimated: >60 mL/min (ref >60)
Glucose, Bld: 87 mg/dL (ref 70–99)
Potassium: 4.1 mmol/L (ref 3.5–5.1)
Sodium: 138 mmol/L (ref 135–145)
Total Bilirubin: 0.6 mg/dL (ref 0.3–1.2)
Total Protein: 7.4 g/dL (ref 6.5–8.1)

## 2022-02-14 LAB — LIPASE, BLOOD: Lipase: 40 U/L (ref 11–51)

## 2022-02-14 LAB — URINALYSIS, ROUTINE W REFLEX MICROSCOPIC
Bilirubin Urine: NEGATIVE
Glucose, UA: NEGATIVE mg/dL
Hgb urine dipstick: NEGATIVE
Ketones, ur: 20 mg/dL — AB
Leukocytes,Ua: NEGATIVE
Nitrite: NEGATIVE
Protein, ur: NEGATIVE mg/dL
Specific Gravity, Urine: 1.006 (ref 1.005–1.030)
pH: 7 (ref 5.0–8.0)

## 2022-02-14 LAB — CBC
HCT: 38 % (ref 36.0–46.0)
Hemoglobin: 12.9 g/dL (ref 12.0–15.0)
MCH: 31.6 pg (ref 26.0–34.0)
MCHC: 33.9 g/dL (ref 30.0–36.0)
MCV: 93.1 fL (ref 80.0–100.0)
Platelets: 276 10*3/uL (ref 150–400)
RBC: 4.08 MIL/uL (ref 3.87–5.11)
RDW: 12.2 % (ref 11.5–15.5)
WBC: 3.8 10*3/uL — ABNORMAL LOW (ref 4.0–10.5)
nRBC: 0 % (ref 0.0–0.2)

## 2022-02-14 MED ORDER — SODIUM CHLORIDE 0.9 % IV BOLUS
1000.0000 mL | Freq: Once | INTRAVENOUS | Status: AC
  Administered 2022-02-14: 1000 mL via INTRAVENOUS

## 2022-02-14 MED ORDER — DICYCLOMINE HCL 10 MG PO CAPS
20.0000 mg | ORAL_CAPSULE | Freq: Once | ORAL | Status: AC
  Administered 2022-02-14: 20 mg via ORAL
  Filled 2022-02-14: qty 2

## 2022-02-14 MED ORDER — DIPHENHYDRAMINE HCL 25 MG PO CAPS
25.0000 mg | ORAL_CAPSULE | Freq: Once | ORAL | Status: AC
  Administered 2022-02-14: 25 mg via ORAL
  Filled 2022-02-14: qty 1

## 2022-02-14 MED ORDER — DICYCLOMINE HCL 20 MG PO TABS
20.0000 mg | ORAL_TABLET | Freq: Two times a day (BID) | ORAL | 0 refills | Status: AC
Start: 2022-02-14 — End: ?

## 2022-02-14 MED ORDER — MORPHINE SULFATE (PF) 4 MG/ML IV SOLN
4.0000 mg | Freq: Once | INTRAVENOUS | Status: AC
  Administered 2022-02-14: 4 mg via INTRAVENOUS
  Filled 2022-02-14: qty 1

## 2022-02-14 MED ORDER — FAMOTIDINE 20 MG PO TABS
20.0000 mg | ORAL_TABLET | Freq: Two times a day (BID) | ORAL | 0 refills | Status: AC
Start: 2022-02-14 — End: ?

## 2022-02-14 MED ORDER — ONDANSETRON HCL 4 MG/2ML IJ SOLN
4.0000 mg | Freq: Once | INTRAMUSCULAR | Status: AC
  Administered 2022-02-14: 4 mg via INTRAVENOUS
  Filled 2022-02-14: qty 2

## 2022-02-14 MED ORDER — ONDANSETRON 4 MG PO TBDP: 4.0000 mg | ORAL_TABLET | Freq: Three times a day (TID) | ORAL | 0 refills | Status: AC | PRN

## 2022-02-14 MED ORDER — METOCLOPRAMIDE HCL 5 MG/ML IJ SOLN
10.0000 mg | Freq: Once | INTRAMUSCULAR | Status: AC
  Administered 2022-02-14: 10 mg via INTRAVENOUS
  Filled 2022-02-14: qty 2

## 2022-02-14 NOTE — ED Triage Notes (Signed)
Abd pain nausea dizziness hx of pancreatitis.  Patient reports symptoms have been going on for 2 days with decrease appetite;

## 2022-02-14 NOTE — ED Provider Notes (Incomplete)
[facility] EMERGENCY DEPARTMENT Provider Note   CSN: [id number] Arrival date & time: 02/14/22  1307     History {Add pertinent medical, surgical, social history, OB history to HPI:1} Chief Complaint  Patient presents with  . Abdominal Pain    Savannah Clark is a 61 y.o. female.   Abdominal Pain  Patient is a 61 year old female with past medical history significant for asthma, anemia, chronic pancreatitis, night terrors, anxiety, celiac disease, PTSD, depression, bronchitis  She is presented emergency room today with complaints of 2 days of nausea and vomiting with nonbloody nonbilious emesis.  She also endorses some left upper abdominal pain.  She denies any chest pain or difficulty breathing she states her abdominal pain is neither pleuritic or exertional.  She denies any cough congestion fevers or chills.  No changes in her bowel movements.  No urinary frequency urgency dysuria hematuria.  She has a history of celiac disease and states that she has been recently eating some gluten containing food     Home Medications Prior to Admission medications   Medication Sig Start Date End Date Taking? Authorizing Provider  acetaminophen-codeine (TYLENOL #3) 300-30 MG tablet Take 1 tablet by mouth 3 (three) times daily as needed. 07/28/21   [provider]  albuterol (PROVENTIL HFA;VENTOLIN HFA) 108 (90 BASE) MCG/ACT inhaler Inhale 2 puffs into the lungs every 6 (six) hours as needed. wheezing     [provider]  aspirin EC 81 MG tablet aspirin 81 mg tablet,delayed release  1 (ONE) TABLET DAILY W/FOOD X1 MONTH    [provider]  Azelastine HCl 137 MCG/SPRAY SOLN SMARTSIG:1-2 Spray(s) Both Nares Twice Daily 01/23/21   [provider]  Biotin 5000 MCG CAPS Take by mouth.    [provider]  buPROPion (WELLBUTRIN XL) 300 MG 24 hr tablet TAKE 1 TABLET (300 MG TOTAL) BY MOUTH EVERY MORNING. 06/23/21   Toy Cookey E, NP   buPROPion (WELLBUTRIN XL) 300 MG 24 hr tablet Take by mouth.    [provider]  CALCIUM PO Take 1 tablet by mouth daily.    [provider]  cetirizine (ZYRTEC) 10 MG tablet Take 10 mg by mouth daily.    [provider]  clobetasol ointment (TEMOVATE) 0.05 % clobetasol 0.05 % topical ointment  APPLY TO WORST AREAS OF SKIN/RASH/ITCHING TWICE DAILY AS NEEDED.    [provider]  cyclobenzaprine (FLEXERIL) 10 MG tablet Take 10 mg by mouth 3 (three) times daily as needed. 10/21/21   [provider]  Dupilumab (DUPIXENT) 300 MG/2ML SOPN Inject into the skin. 02/11/21   [provider]  etodolac (LODINE XL) 400 MG 24 hr tablet Take 400 mg by mouth daily. 09/20/21   [provider]  FLUoxetine (PROZAC) 10 MG capsule Take 1 capsule (10 mg total) by mouth daily. 01/23/21   Toy Cookey E, NP  Fluoxetine HCl, PMDD, 10 MG TABS Take by mouth.    [provider]  fluticasone (FLONASE) 50 MCG/ACT nasal spray Place 1 spray into both nostrils daily for 5 doses. 07/04/18 04/28/21  Claude Manges, PA-C  fluticasone (FLONASE) 50 MCG/ACT nasal spray fluticasone propionate 50 mcg/actuation nasal spray,suspension  INHALE 2 (TWO) SPRAY BY NOSE AT BEDTIME    [provider]  Fluticasone-Salmeterol (ADVAIR) 250-50 MCG/DOSE AEPB Inhale 1 puff into the lungs 2 (two) times daily.    [provider]  HYDROcodone-acetaminophen (NORCO/VICODIN) 5-325 MG tablet hydrocodone 5 mg-acetaminophen 325 mg tablet  TAKE 1 (ONE) TABLET FOUR  TIMES DAILY, AS NEEDED FOR PAIN    [provider]  hydrocortisone 2.5 % cream hydrocortisone 2.5 % topical cream  APPLY TO AFFECTED AREA OF THE FACE NECK GROIN TWICE DAILY AS NEEDED FOR ITCHING    [provider]  hydroquinone 4 % cream hydroquinone 4 % topical cream  APPLY TOPICALLY 2 TIMES DAILY. NIGHTLY TO DARK SPOTS ONLY.    [provider]  hydrOXYzine (ATARAX) 25 MG tablet TAKE 1  TABLET BY MOUTH THREE TIMES A DAY AS NEEDED 01/27/22   Shanna Cisco, NP  hydrOXYzine (ATARAX) 50 MG tablet hydroxyzine HCl 50 mg tablet    [provider]  magnesium 30 MG tablet Take 30 mg by mouth 2 (two) times daily.    [provider]  Greggory Keen 10 MG/0.5ML Pen SMARTSIG:10 Milligram(s) SUB-Q Once a Week 11/06/21   [provider]  ondansetron (ZOFRAN ODT) 8 MG disintegrating tablet 8mg  ODT q8  hours prn nausea 04/08/19   Palumbo, April, MD  polyethylene glycol powder (GLYCOLAX/MIRALAX) 17 GM/SCOOP powder polyethylene glycol 3350 17 gram/dose oral powder  TAKE 1 CAPFUL BY MOUTH AS DIRECTED    [provider]  Prenatal Vit-Fe Fumarate-FA (M-NATAL PLUS) 27-1 MG TABS Take 1 tablet by mouth daily. 11/10/21   [provider]  promethazine (PHENERGAN) 12.5 MG tablet Take by mouth.    [provider]  tacrolimus (PROTOPIC) 0.1 % ointment Protopic 0.1 % topical ointment  APPLY TOPICALLY TO AFFECTED AREAS OF FACE DAILY AS NEEDED    [provider]  tirzepatide 01/10/22) 10 MG/0.5ML Pen Inject 1 pen (10 MG) into the skin once a week 01/01/22     traZODone (DESYREL) 50 MG tablet Take 100 mg by mouth at bedtime as needed. 11/04/21   [provider]      Allergies    Adhesive [tape], Amoxicillin-pot clavulanate, Aspirin, Farxiga [dapagliflozin], Gluten meal, Januvia [sitagliptin], Lactose intolerance (gi), and Wheat    Review of Systems   Review of Systems  Gastrointestinal:  Positive for abdominal pain.    Physical Exam Updated Vital Signs BP (!) 141/98   Pulse 77   Temp 97.7 F (36.5 C) (Oral)   Resp 14   Ht 5\' 7"  (1.702 m)   Wt 60.3 kg   SpO2 100%   BMI 20.83 kg/m  Physical Exam  ED Results / Procedures / Treatments   Labs (all labs ordered are listed, but only abnormal results are displayed) Labs Reviewed  CBC - Abnormal; Notable for the following components:      Result Value   WBC 3.8 (*)    All other  components within normal limits  URINALYSIS, ROUTINE W REFLEX MICROSCOPIC - Abnormal; Notable for the following components:   Ketones, ur 20 (*)    All other components within normal limits  LIPASE, BLOOD  COMPREHENSIVE METABOLIC PANEL    EKG None  Radiology No results found.  Procedures Procedures  {Document cardiac monitor, telemetry assessment procedure when appropriate:1}  Medications Ordered in ED Medications - No data to display  ED Course/ Medical Decision Making/ A&P Clinical Course as of 02/14/22 2159  Sat Feb 14, 2022  2156 On reassessment patient has no abdominal tenderness.  She states that she is currently experiencing no pain however she did have an episode of emesis after Zofran.  We will give an additional dose of antiemetic and reassess. [WF]    Clinical Course User Index [WF] Feb 16, 2022, 2157  Medical Decision Making Amount and/or Complexity of Data Reviewed Labs: ordered.  Risk Prescription drug management.   ***  {Document critical care time when appropriate:1} {Document review of labs and clinical decision tools ie heart score, Chads2Vasc2 etc:1}  {Document your independent review of radiology images, and any outside records:1} {Document your discussion with family members, caretakers, and with consultants:1} {Document social determinants of health affecting pt's care:1} {Document your decision making why or why not admission, treatments were needed:1} Final Clinical Impression(s) / ED Diagnoses Final diagnoses:  None    Rx / DC Orders ED Discharge Orders     None

## 2022-02-14 NOTE — ED Notes (Signed)
Patient able to tolerate water at this time. Pt states she does not want to drink any ginger ale or eat saltine crackers at this time.

## 2022-02-14 NOTE — Discharge Instructions (Signed)
I have written you a prescription for his medications that should help with your symptoms.  Take Zofran as needed for nausea.  Drink plenty of water I recommend bland foods and follow your celiac diet.  Follow-up with your primary care provider and a gastroenterologist.  If you do not have a gastroenterologist you may follow-up with the office I have provided information for.

## 2022-02-15 NOTE — ED Provider Notes (Signed)
[facility] EMERGENCY DEPARTMENT Provider Note   CSN: [id number] Arrival date & time: 02/14/22  1307     History  Chief Complaint  Patient presents with   Abdominal Pain    Savannah Clark is a 61 y.o. female.   Abdominal Pain  Patient is a 61 year old female with past medical history significant for asthma, anemia, chronic pancreatitis, night terrors, anxiety, celiac disease, PTSD, depression, bronchitis  She is presented emergency room today with complaints of 2 days of nausea and vomiting with nonbloody nonbilious emesis.  She also endorses some left upper abdominal pain.  She denies any chest pain or difficulty breathing she states her abdominal pain is neither pleuritic or exertional.  She denies any cough congestion fevers or chills.  No changes in her bowel movements.  No urinary frequency urgency dysuria hematuria.  She has a history of celiac disease and states that she has been recently eating some gluten containing food     Home Medications Prior to Admission medications   Medication Sig Start Date End Date Taking? Authorizing Provider  dicyclomine (BENTYL) 20 MG tablet Take 1 tablet (20 mg total) by mouth 2 (two) times daily. 02/14/22  Yes Anela Bensman S, PA  famotidine (PEPCID) 20 MG tablet Take 1 tablet (20 mg total) by mouth 2 (two) times daily. 02/14/22  Yes Mende Biswell S, PA  ondansetron (ZOFRAN-ODT) 4 MG disintegrating tablet Take 1 tablet (4 mg total) by mouth every 8 (eight) hours as needed for nausea or vomiting. 02/14/22  Yes Iliany Losier S, PA  acetaminophen-codeine (TYLENOL #3) 300-30 MG tablet Take 1 tablet by mouth 3 (three) times daily as needed. 07/28/21   [provider]  albuterol (PROVENTIL HFA;VENTOLIN HFA) 108 (90 BASE) MCG/ACT inhaler Inhale 2 puffs into the lungs every 6 (six) hours as needed. wheezing     [provider]  aspirin EC 81 MG tablet aspirin 81 mg tablet,delayed release  1 (ONE) TABLET DAILY W/FOOD  X1 MONTH    [provider]  Azelastine HCl 137 MCG/SPRAY SOLN SMARTSIG:1-2 Spray(s) Both Nares Twice Daily 01/23/21   [provider]  Biotin 5000 MCG CAPS Take by mouth.    [provider]  buPROPion (WELLBUTRIN XL) 300 MG 24 hr tablet TAKE 1 TABLET (300 MG TOTAL) BY MOUTH EVERY MORNING. 06/23/21   Toy Cookey E, NP  buPROPion (WELLBUTRIN XL) 300 MG 24 hr tablet Take by mouth.    [provider]  CALCIUM PO Take 1 tablet by mouth daily.    [provider]  cetirizine (ZYRTEC) 10 MG tablet Take 10 mg by mouth daily.    [provider]  clobetasol ointment (TEMOVATE) 0.05 % clobetasol 0.05 % topical ointment  APPLY TO WORST AREAS OF SKIN/RASH/ITCHING TWICE DAILY AS NEEDED.    [provider]  cyclobenzaprine (FLEXERIL) 10 MG tablet Take 10 mg by mouth 3 (three) times daily as needed. 10/21/21   [provider]  Dupilumab (DUPIXENT) 300 MG/2ML SOPN Inject into the skin. 02/11/21   [provider]  etodolac (LODINE XL) 400 MG 24 hr tablet Take 400 mg by mouth daily. 09/20/21   [provider]  FLUoxetine (PROZAC) 10 MG capsule Take 1 capsule (10 mg total) by mouth daily. 01/23/21   Toy Cookey E, NP  Fluoxetine HCl, PMDD, 10 MG TABS Take by mouth.    [provider]  fluticasone (FLONASE) 50 MCG/ACT nasal spray Place 1 spray into both nostrils daily for 5 doses. 07/04/18  04/28/21  Claude Manges, PA-C  fluticasone (FLONASE) 50 MCG/ACT nasal spray fluticasone propionate 50 mcg/actuation nasal spray,suspension  INHALE 2 (TWO) SPRAY BY NOSE AT BEDTIME    [provider]  Fluticasone-Salmeterol (ADVAIR) 250-50 MCG/DOSE AEPB Inhale 1 puff into the lungs 2 (two) times daily.    [provider]  HYDROcodone-acetaminophen (NORCO/VICODIN) 5-325 MG tablet hydrocodone 5 mg-acetaminophen 325 mg tablet  TAKE 1 (ONE) TABLET FOUR TIMES DAILY, AS NEEDED FOR PAIN    [provider]   hydrocortisone 2.5 % cream hydrocortisone 2.5 % topical cream  APPLY TO AFFECTED AREA OF THE FACE NECK GROIN TWICE DAILY AS NEEDED FOR ITCHING    [provider]  hydroquinone 4 % cream hydroquinone 4 % topical cream  APPLY TOPICALLY 2 TIMES DAILY. NIGHTLY TO DARK SPOTS ONLY.    [provider]  hydrOXYzine (ATARAX) 25 MG tablet TAKE 1 TABLET BY MOUTH THREE TIMES A DAY AS NEEDED 01/27/22   Shanna Cisco, NP  hydrOXYzine (ATARAX) 50 MG tablet hydroxyzine HCl 50 mg tablet    [provider]  magnesium 30 MG tablet Take 30 mg by mouth 2 (two) times daily.    [provider]  MOUNJARO 10 MG/0.5ML Pen SMARTSIG:10 Milligram(s) SUB-Q Once a Week 11/06/21   [provider]  polyethylene glycol powder (GLYCOLAX/MIRALAX) 17 GM/SCOOP powder polyethylene glycol 3350 17 gram/dose oral powder  TAKE 1 CAPFUL BY MOUTH AS DIRECTED    [provider]  Prenatal Vit-Fe Fumarate-FA (M-NATAL PLUS) 27-1 MG TABS Take 1 tablet by mouth daily. 11/10/21   [provider]  promethazine (PHENERGAN) 12.5 MG tablet Take by mouth.    [provider]  tacrolimus (PROTOPIC) 0.1 % ointment Protopic 0.1 % topical ointment  APPLY TOPICALLY TO AFFECTED AREAS OF FACE DAILY AS NEEDED    [provider]  tirzepatide Greggory Keen) 10 MG/0.5ML Pen Inject 1 pen (10 MG) into the skin once a week 01/01/22     traZODone (DESYREL) 50 MG tablet Take 100 mg by mouth at bedtime as needed. 11/04/21   [provider]      Allergies    Adhesive [tape], Amoxicillin-pot clavulanate, Aspirin, Farxiga [dapagliflozin], Gluten meal, Januvia [sitagliptin], Lactose intolerance (gi), Morphine, and Wheat    Review of Systems   Review of Systems  Gastrointestinal:  Positive for abdominal pain.    Physical Exam Updated Vital Signs BP (!) 148/75 (BP Location: Right Arm)   Pulse 64   Temp 97.6 F (36.4 C) (Oral)   Resp 17   Ht 5\' 7"  (1.702 m)   Wt 60.3 kg   SpO2  97%   BMI 20.83 kg/m  Physical Exam Vitals and nursing note reviewed.  Constitutional:      General: She is not in acute distress.    Comments: 61 year old female in no acute distress HENT:     Head: Normocephalic and atraumatic.     Nose: Nose normal.     Mouth/Throat:     Comments: Slightly dry oral mucosa Eyes:     General: No scleral icterus. Cardiovascular:     Rate and Rhythm: Normal rate and regular rhythm.     Pulses: Normal pulses.     Heart sounds: Normal heart sounds.  Pulmonary:     Effort: Pulmonary effort is normal. No respiratory distress.     Breath sounds: No wheezing.  Abdominal:     Palpations: Abdomen is soft.     Tenderness: There is no abdominal tenderness.  Musculoskeletal:  Cervical back: Normal range of motion.     Right lower leg: No edema.     Left lower leg: No edema.  Skin:    General: Skin is warm and dry.     Capillary Refill: Capillary refill takes less than 2 seconds.  Neurological:     Mental Status: He is alert. Mental status is at baseline.     Comments: Alert and oriented, able to answer questions via interpreter and follow commands  Psychiatric:        Mood and Affect: Mood normal.        Behavior: Behavior normal.   ED Results / Procedures / Treatments   Labs (all labs ordered are listed, but only abnormal results are displayed) Labs Reviewed  CBC - Abnormal; Notable for the following components:      Result Value   WBC 3.8 (*)    All other components within normal limits  URINALYSIS, ROUTINE W REFLEX MICROSCOPIC - Abnormal; Notable for the following components:   Ketones, ur 20 (*)    All other components within normal limits  LIPASE, BLOOD  COMPREHENSIVE METABOLIC PANEL    EKG None  Radiology No results found.  Procedures Procedures    Medications Ordered in ED Medications  sodium chloride 0.9 % bolus 1,000 mL (0 mLs Intravenous Stopped 02/14/22 2055)  morphine (PF) 4 MG/ML injection 4 mg (4 mg Intravenous  Given 02/14/22 1954)  ondansetron (ZOFRAN) injection 4 mg (4 mg Intravenous Given 02/14/22 1953)  dicyclomine (BENTYL) capsule 20 mg (20 mg Oral Given 02/14/22 2055)  metoCLOPramide (REGLAN) injection 10 mg (10 mg Intravenous Given 02/14/22 2220)  diphenhydrAMINE (BENADRYL) capsule 25 mg (25 mg Oral Given 02/14/22 2220)    ED Course/ Medical Decision Making/ A&P Clinical Course as of 02/15/22 2200  Sat Feb 14, 2022  2156 On reassessment patient has no abdominal tenderness.  She states that she is currently experiencing no pain however she did have an episode of emesis after Zofran.  We will give an additional dose of antiemetic and reassess. [WF]    Clinical Course User Index [WF] Gailen Shelter, [place]                           Medical Decision Making Amount and/or Complexity of Data Reviewed Labs: ordered.  Risk Prescription drug management.  Patient is a 61 year old female with past medical history significant for asthma, anemia, chronic pancreatitis, night terrors, anxiety, celiac disease, PTSD, depression, bronchitis  She is presented emergency room today with complaints of 2 days of nausea and vomiting with nonbloody nonbilious emesis.  She also endorses some left upper abdominal pain.  She denies any chest pain or difficulty breathing she states her abdominal pain is neither pleuritic or exertional.  She denies any cough congestion fevers or chills.  No changes in her bowel movements.  No urinary frequency urgency dysuria hematuria.  She has a history of celiac disease and states that she has been recently eating some gluten containing food   I personally reviewed all laboratory work and imaging.  Metabolic panel without any acute abnormality specifically kidney function within normal limits and no significant electrolyte abnormalities. CBC without leukocytosis or significant anemia.  Mild leukopenia - chronic and improved from baseline.   Patient given Benadryl Reglan, Bentyl, 1 L  crystalloid, morphine and Zofran.  History overall feels much improved.  Discharged home at this time. Tolerating PO  Final Clinical Impression(s) / ED Diagnoses  Final diagnoses:  Left upper quadrant abdominal pain    Rx / DC Orders ED Discharge Orders          Ordered    dicyclomine (BENTYL) 20 MG tablet  2 times daily        02/14/22 1956    ondansetron (ZOFRAN-ODT) 4 MG disintegrating tablet  Every 8 hours PRN        02/14/22 1956    famotidine (PEPCID) 20 MG tablet  2 times daily        02/14/22 1956              Gailen Shelter, [place] 02/15/22 2309    Jacalyn Lefevre, MD 02/15/22 2319

## 2022-02-16 ENCOUNTER — Institutional Professional Consult (permissible substitution): Payer: Medicaid Other | Admitting: Plastic Surgery

## 2022-02-17 ENCOUNTER — Institutional Professional Consult (permissible substitution): Payer: Medicaid Other | Admitting: Plastic Surgery

## 2022-02-23 ENCOUNTER — Telehealth: Payer: Self-pay | Admitting: *Deleted

## 2022-02-23 NOTE — Telephone Encounter (Signed)
Called patient to schedue surgery but she wants to wait until she meets Dr. Erin Hearing. She will call me back if she chooses to proceed.

## 2022-04-01 ENCOUNTER — Institutional Professional Consult (permissible substitution): Payer: Medicaid Other | Admitting: Plastic Surgery

## 2022-05-01 ENCOUNTER — Other Ambulatory Visit (HOSPITAL_COMMUNITY): Payer: Self-pay | Admitting: Psychiatry

## 2022-05-01 DIAGNOSIS — F411 Generalized anxiety disorder: Secondary | ICD-10-CM

## 2022-05-05 ENCOUNTER — Other Ambulatory Visit (HOSPITAL_COMMUNITY): Payer: Self-pay | Admitting: Psychiatry

## 2022-05-05 DIAGNOSIS — F411 Generalized anxiety disorder: Secondary | ICD-10-CM

## 2022-06-17 ENCOUNTER — Other Ambulatory Visit: Payer: Self-pay | Admitting: Neurological Surgery

## 2022-06-17 DIAGNOSIS — M542 Cervicalgia: Secondary | ICD-10-CM

## 2022-06-17 DIAGNOSIS — M5416 Radiculopathy, lumbar region: Secondary | ICD-10-CM

## 2022-06-30 ENCOUNTER — Ambulatory Visit
Admission: RE | Admit: 2022-06-30 | Discharge: 2022-06-30 | Disposition: A | Discharge status: Home / Self Care | Payer: Medicaid Other | Source: Ambulatory Visit | Attending: Neurological Surgery | Admitting: Neurological Surgery

## 2022-06-30 DIAGNOSIS — M542 Cervicalgia: Secondary | ICD-10-CM

## 2022-06-30 DIAGNOSIS — M5416 Radiculopathy, lumbar region: Secondary | ICD-10-CM

## 2022-07-05 ENCOUNTER — Other Ambulatory Visit (HOSPITAL_COMMUNITY): Payer: Self-pay | Admitting: Psychiatry

## 2022-07-05 DIAGNOSIS — F411 Generalized anxiety disorder: Secondary | ICD-10-CM

## 2022-07-10 ENCOUNTER — Other Ambulatory Visit: Payer: Self-pay | Admitting: Neurological Surgery

## 2022-07-21 NOTE — Pre-Procedure Instructions (Signed)
Surgical Instructions    Your procedure is scheduled on July 29, 2022.  Report to [facility] Main Entrance "A" at 6:30 A.M., then check in with the Admitting office.  Call this number if you have problems the morning of surgery:  [phone]  If you have any questions prior to your surgery date call [phone]: Open Monday-Friday 8am-4pm If you experience any cold or flu symptoms such as cough, fever, chills, shortness of breath, etc. between now and your scheduled surgery, please notify us at the above number.     Remember:  Do not eat or drink after midnight the night before your surgery     Take these medicines the morning of surgery with A SIP OF WATER:  buPROPion (WELLBUTRIN XL)   FLUoxetine (PROZAC)   Fluticasone-Salmeterol (ADVAIR)     May take these medicines IF NEEDED:  albuterol (PROVENTIL HFA;VENTOLIN HFA) inhaler   Azelastine HCl SPRAY SOLN   cyclobenzaprine (FLEXERIL)   fluticasone (FLONASE) nasal spray   hydrOXYzine (ATARAX)   ondansetron (ZOFRAN-ODT)   promethazine (PHENERGAN)    STOP taking tirzepatide [facility]) one week prior to your surgery.    Follow your surgeon's instructions on when to stop Aspirin.  If no instructions were given by your surgeon then you will need to call the office to get those instructions.     As of today, STOP taking any Aleve, Naproxen, Ibuprofen, Motrin, Advil, Goody's, BC's, all herbal medications, fish oil, and all vitamins.                     Do NOT Smoke (Tobacco/Vaping) for 24 hours prior to your procedure.  If you use a CPAP at night, you may bring your mask/headgear for your overnight stay.   Contacts, glasses, piercing's, hearing aid's, dentures or partials may not be worn into surgery, please bring cases for these belongings.    For patients admitted to the hospital, discharge time will be determined by your treatment team.   Patients discharged the day of surgery will not be allowed to drive home, and  someone needs to stay with them for 24 hours.  SURGICAL WAITING ROOM VISITATION Patients having surgery or a procedure may have no more than 2 support people in the waiting area - these visitors may rotate.   Children under the age of 31 must have an adult with them who is not the patient. If the patient needs to stay at the hospital during part of their recovery, the visitor guidelines for inpatient rooms apply. Pre-op nurse will coordinate an appropriate time for 1 support person to accompany patient in pre-op.  This support person may not rotate.   Please refer to the [facility] website for the visitor guidelines for Inpatients (after your surgery is over and you are in a regular room).    Special instructions:   [place]- Preparing For Surgery  Before surgery, you can play an important role. Because skin is not sterile, your skin needs to be as free of germs as possible. You can reduce the number of germs on your skin by washing with CHG (chlorahexidine gluconate) Soap before surgery.  CHG is an antiseptic cleaner which kills germs and bonds with the skin to continue killing germs even after washing.    Oral Hygiene is also important to reduce your risk of infection.  Remember - BRUSH YOUR TEETH THE MORNING OF SURGERY WITH YOUR REGULAR TOOTHPASTE  Please do not use if you have an allergy to CHG  or antibacterial soaps. If your skin becomes reddened/irritated stop using the CHG.  Do not shave (including legs and underarms) for at least 48 hours prior to first CHG shower. It is OK to shave your face.  Please follow these instructions carefully.   Shower the NIGHT BEFORE SURGERY and the MORNING OF SURGERY  If you chose to wash your hair, wash your hair first as usual with your normal shampoo.  After you shampoo, rinse your hair and body thoroughly to remove the shampoo.  Use CHG Soap as you would any other liquid soap. You can apply CHG directly to the skin and wash gently with a  scrungie or a clean washcloth.   Apply the CHG Soap to your body ONLY FROM THE NECK DOWN.  Do not use on open wounds or open sores. Avoid contact with your eyes, ears, mouth and genitals (private parts). Wash Face and genitals (private parts)  with your normal soap.   Wash thoroughly, paying special attention to the area where your surgery will be performed.  Thoroughly rinse your body with warm water from the neck down.  DO NOT shower/wash with your normal soap after using and rinsing off the CHG Soap.  Pat yourself dry with a CLEAN TOWEL.  Wear CLEAN PAJAMAS to bed the night before surgery  Place CLEAN SHEETS on your bed the night before your surgery  DO NOT SLEEP WITH PETS.   Day of Surgery: Take a shower with CHG soap. Do not wear jewelry or makeup Do not wear lotions, powders, perfumes/colognes, or deodorant. Do not shave 48 hours prior to surgery.   Do not bring valuables to the hospital.  [facility] is not responsible for any belongings or valuables. Do not wear nail polish, gel polish, artificial nails, or any other type of covering on natural nails (fingers and toes) If you have artificial nails or gel coating that need to be removed by a nail salon, please have this removed prior to surgery. Artificial nails or gel coating may interfere with anesthesia's ability to adequately monitor your vital signs.  Wear Clean/Comfortable clothing the morning of surgery Remember to brush your teeth WITH YOUR REGULAR TOOTHPASTE.   Please read over the following fact sheets that you were given.    If you received a COVID test during your pre-op visit  it is requested that you wear a mask when out in public, stay away from anyone that may not be feeling well and notify your surgeon if you develop symptoms. If you have been in contact with anyone that has tested positive in the last 10 days please notify you surgeon.

## 2022-07-22 ENCOUNTER — Encounter (HOSPITAL_COMMUNITY): Payer: Self-pay

## 2022-07-22 ENCOUNTER — Encounter (HOSPITAL_COMMUNITY)
Admission: RE | Admit: 2022-07-22 | Discharge: 2022-07-22 | Disposition: A | Discharge status: Home / Self Care | Payer: Medicaid Other | Source: Ambulatory Visit | Attending: Neurological Surgery | Admitting: Neurological Surgery

## 2022-07-22 ENCOUNTER — Other Ambulatory Visit: Payer: Self-pay

## 2022-07-22 VITALS — BP 124/89 | HR 85 | Temp 97.8°F | Resp 17 | Ht 67.0 in | Wt 121.5 lb

## 2022-07-22 DIAGNOSIS — E119 Type 2 diabetes mellitus without complications: Secondary | ICD-10-CM | POA: Insufficient documentation

## 2022-07-22 DIAGNOSIS — Z01818 Encounter for other preprocedural examination: Secondary | ICD-10-CM | POA: Insufficient documentation

## 2022-07-22 HISTORY — DX: Type 2 diabetes mellitus without complications: E11.9

## 2022-07-22 HISTORY — DX: Decreased white blood cell count, unspecified: D72.819

## 2022-07-22 HISTORY — DX: Sleep apnea, unspecified: G47.30

## 2022-07-22 HISTORY — DX: Gastro-esophageal reflux disease without esophagitis: K21.9

## 2022-07-22 HISTORY — DX: Unspecified osteoarthritis, unspecified site: M19.90

## 2022-07-22 HISTORY — DX: Essential (primary) hypertension: I10

## 2022-07-22 LAB — CBC
HCT: 36.2 % (ref 36.0–46.0)
Hemoglobin: 12.4 g/dL (ref 12.0–15.0)
MCH: 31.6 pg (ref 26.0–34.0)
MCHC: 34.3 g/dL (ref 30.0–36.0)
MCV: 92.1 fL (ref 80.0–100.0)
Platelets: 253 10*3/uL (ref 150–400)
RBC: 3.93 MIL/uL (ref 3.87–5.11)
RDW: 11.9 % (ref 11.5–15.5)
WBC: 2.6 10*3/uL — ABNORMAL LOW (ref 4.0–10.5)
nRBC: 0 % (ref 0.0–0.2)

## 2022-07-22 LAB — BASIC METABOLIC PANEL
Anion gap: 11 (ref 5–15)
BUN: 10 mg/dL (ref 8–23)
CO2: 25 mmol/L (ref 22–32)
Calcium: 9.2 mg/dL (ref 8.9–10.3)
Chloride: 103 mmol/L (ref 98–111)
Creatinine, Ser: 0.97 mg/dL (ref 0.44–1.00)
GFR, Estimated: >60 mL/min (ref >60)
Glucose, Bld: 86 mg/dL (ref 70–99)
Potassium: 3.9 mmol/L (ref 3.5–5.1)
Sodium: 139 mmol/L (ref 135–145)

## 2022-07-22 LAB — PROTIME-INR
INR: 1.1 (ref 0.8–1.2)
Prothrombin Time: 13.9 seconds (ref 11.4–15.2)

## 2022-07-22 LAB — SURGICAL PCR SCREEN
MRSA, PCR: NEGATIVE
Staphylococcus aureus: NEGATIVE

## 2022-07-22 LAB — GLUCOSE, CAPILLARY: Glucose-Capillary: 95 mg/dL (ref 70–99)

## 2022-07-22 NOTE — Progress Notes (Addendum)
PCP - Simona Huh, FNP with [facility] Cardiologist - Denies  PPM/ICD - Denies Device Orders - n/a Rep Notified - n/a  Chest x-ray - n/a EKG - 07/22/2022 Stress Test - Per pt, had one three years ago as part of regular testing. Record Requested. ECHO - Denies Cardiac Cath - Denies  Sleep Study - Positive sleep study 2 years ago, but pt was unable to tolerate mask. She does endorse that her symptoms have improved since she has lost 50+ lbs after start Mounjaro   Pt is DM2. She checks her blood sugar once per day. Normal fasting blood sugar is around 95. CBG at appointment was 95  Last dose of GLP1 agonist- Last Dose of Mounjaro was 07/15/22 GLP1 instructions: Pt instructed to not take her dose of Mounjaro today.   Blood Thinner Instructions: n/a Aspirin Instructions: Pt stopped ASA one week ago  NPO after midnight  COVID TEST- n/a   Anesthesia review: Yes. Stress test requested.   Patient denies shortness of breath, fever, cough and chest pain at PAT appointment   All instructions explained to the patient, with a verbal understanding of the material. Patient agrees to go over the instructions while at home for a better understanding. Patient also instructed to self quarantine after being tested for COVID-19. The opportunity to ask questions was provided.

## 2022-07-23 LAB — HEMOGLOBIN A1C
Hgb A1c MFr Bld: 5.4 % (ref 4.8–5.6)
Mean Plasma Glucose: 108 mg/dL

## 2022-07-24 ENCOUNTER — Encounter (HOSPITAL_COMMUNITY): Payer: Self-pay

## 2022-07-24 NOTE — Anesthesia Preprocedure Evaluation (Signed)
Anesthesia Evaluation  Patient identified by MRN, date of birth, ID band Patient awake    Reviewed: Allergy & Precautions, H&P , NPO status , Patient's Chart, lab work & pertinent test results  Airway Mallampati: I  TM Distance: >3 FB Neck ROM: Full    Dental  (+) Teeth Intact, Dental Advisory Given   Pulmonary asthma , sleep apnea (could not tolerate CPAP, snores less after weight loss) , former smoker Quit smoking 2009 Advair only uses as needed, albuterol last use last week   Pulmonary exam normal breath sounds clear to auscultation       Cardiovascular Normal cardiovascular exam Rhythm:Regular Rate:Normal  cardiac testing at [facility] around 3 years ago. I contacted [facility] and only cardiac testing within the past three years was an echocardiogram and EKG. Her 09/27/19 echo was normal. 07/22/22 EKG showed NSR.    Neuro/Psych  PSYCHIATRIC DISORDERS Anxiety Depression       GI/Hepatic Neg liver ROS,GERD  Medicated,,Chronic pancreatitis    Endo/Other  diabetes, Well Controlled, Type 2  A1c 5.4, FS 88 this AM  Renal/GU Renal InsufficiencyRenal diseaseCKD 3 but normal Cr, CrCl  negative genitourinary   Musculoskeletal  (+) Arthritis , Osteoarthritis and Rheumatoid disorders,    Abdominal   Peds negative pediatric ROS (+)  Hematology negative hematology ROS (+) Hb 12.4   Anesthesia Other Findings Mounjaro LD: 07/15/22  Reproductive/Obstetrics negative OB ROS                             Anesthesia Physical Anesthesia Plan  ASA: 2  Anesthesia Plan: General   Post-op Pain Management: Tylenol PO (pre-op)*   Induction: Intravenous  PONV Risk Score and Plan: 3 and Ondansetron, Dexamethasone, Midazolam and Treatment may vary due to age or medical condition  Airway Management Planned: Oral ETT and Video Laryngoscope Planned  Additional Equipment: None  Intra-op Plan:    Post-operative Plan: Extubation in OR  Informed Consent: I have reviewed the patients History and Physical, chart, labs and discussed the procedure including the risks, benefits and alternatives for the proposed anesthesia with the patient or authorized representative who has indicated his/her understanding and acceptance.     Dental advisory given  Plan Discussed with: CRNA  Anesthesia Plan Comments: (  )       Anesthesia Quick Evaluation

## 2022-07-24 NOTE — Progress Notes (Signed)
Anesthesia Chart Review:  Case: Savannah Clark Date/Time: 07/29/22 0815   Procedure: ACDF - C3-C4 - C4-C5 - C5-C6   Anesthesia type: General   Pre-op diagnosis: Stenosis   Location: [facility] OR ROOM 19 / [facility]   Surgeons: Eustace Moore, MD       DISCUSSION: Patient is a 62 year old female scheduled for the above procedure.  History includes former smoker (quit 06/09/07), asthma, anemia, celiac disease, chronic pancreatitis, GERD, CKD (stage III), DM2, HTN, OSA (intolerant to CPAP; symptoms improved after 50 lb weight loss on Mounjaro), RA, osteoarthritis, PTSD, night terrors, leukopenia, breast augmentation.  She reported cardiac testing at [facility] around 3 years ago. I contacted [facility] and only cardiac testing within the past three years was an echocardiogram and EKG. Her 09/27/19 echo was normal. 07/22/22 EKG showed NSR. She denied chest pain and SOB per PAT RN interview.  Last [facility] 07/15/22 and advised to hold until after surgery.  ASA on hold for surgery.    VS: BP 124/89   Pulse 85   Temp 36.6 C   Resp 17   Ht 5' 7"$  (1.702 m)   Wt 55.1 kg   SpO2 100%   BMI 19.03 kg/m    PROVIDERS: Simona Huh, NP is PCP [facility], [facility]) Sullivan Lone, MD is HEM. Last visit 09/09/21 for follow-up chronic leukopenia with mild neutropenia. Previous myeloma panel showed no monoclonal protein. Continue to monitor, and if worsening may need to pursue bone marrow biopsy in the future.    LABS: Preoperative labs noted. WBC 2.6, previously 3.2-3.8 since 04/28/21 in [facility], followed by hematology for chronic leukopenia. LFTS and Lipase normal 02/14/22. (all labs ordered are listed, but only abnormal results are displayed)  Labs Reviewed  CBC - Abnormal; Notable for the following components:      Result Value   WBC 2.6 (*)    All other components within normal limits  SURGICAL PCR SCREEN  GLUCOSE, CAPILLARY  PROTIME-INR  HEMOGLOBIN [id number]  BASIC METABOLIC PANEL     IMAGES: MRI  L-spine 06/30/22: IMPRESSION: 1. Lower lumbar facet spurring and mild disc degeneration. 2. L5-S1 small right paracentral herniation that could affect the right S1 nerve root.   MRI C-spine 06/30/22: IMPRESSION: 1. Severe spinal canal stenosis at C3-C4 and C4-C5 with severe bilateral neural foraminal narrowing at both levels. 2. Moderate spinal canal stenosis at C5-C6 with severe right and moderate left neural foraminal narrowing. 3. Moderate spinal canal stenosis at C6-C7 with mild-to-moderate bilateral neural foraminal narrowing. 4. Additional degenerative changes, as above.   EKG: 07/22/22: NSR   CV: Echo 09/07/19 Romelle Starcher):  Conclusions: Normal study: Normal cardiac chamber sizes and function.  Normal valve anatomy and function.  No pericardial effusion or intracardiac mass.  No intracardiac shunts by 2D and color-flow imaging.  Normal thoracic aorta and aortic arch.   Past Medical History:  Diagnosis Date   Anemia    Anxiety    Arthritis    RA and OA   Asthma    Bronchitis    Celiac disease    Chronic leukopenia    Depression    Diabetes mellitus without complication (HCC)    Type 2   GERD (gastroesophageal reflux disease)    Hypertension    Hx with increased weight   Night terror disorder    Pancreatitis, chronic (HCC)    PTSD (post-traumatic stress disorder)    Renal disorder    CKD 3   Sleep apnea  Past Surgical History:  Procedure Laterality Date   BREAST SURGERY     Breast Augmentation   COLONOSCOPY  2022   ESOPHAGOGASTRODUODENOSCOPY ENDOSCOPY  2022   FOOT SURGERY Bilateral    Hammer Toe   RHINOPLASTY     TONSILLECTOMY  1970    MEDICATIONS:  albuterol (PROVENTIL HFA;VENTOLIN HFA) 108 (90 BASE) MCG/ACT inhaler   aspirin EC 81 MG tablet   Azelastine HCl 137 MCG/SPRAY SOLN   Biotin 5000 MCG CAPS   buPROPion (WELLBUTRIN XL) 300 MG 24 hr tablet   CALCIUM PO   cyclobenzaprine (FLEXERIL) 10 MG tablet   dicyclomine (BENTYL) 20 MG tablet    Dupilumab (DUPIXENT) 300 MG/2ML SOPN   famotidine (PEPCID) 20 MG tablet   FLUoxetine (PROZAC) 10 MG capsule   fluticasone (FLONASE) 50 MCG/ACT nasal spray   Fluticasone-Salmeterol (ADVAIR) 250-50 MCG/DOSE AEPB   hydroquinone 4 % cream   hydrOXYzine (ATARAX) 25 MG tablet   LINZESS 145 MCG CAPS capsule   ondansetron (ZOFRAN-ODT) 4 MG disintegrating tablet   ondansetron (ZOFRAN-ODT) 8 MG disintegrating tablet   promethazine (PHENERGAN) 12.5 MG tablet   tirzepatide (MOUNJARO) 10 MG/0.5ML Pen   traZODone (DESYREL) 50 MG tablet   No current facility-administered medications for this encounter.    Myra Gianotti, PA-C Surgical Short Stay/Anesthesiology [facility] Phone [phone] [facility] Phone [phone] 07/24/2022 10:09 AM

## 2022-07-29 ENCOUNTER — Ambulatory Visit (HOSPITAL_COMMUNITY)
Admission: RE | Disposition: A | Discharge status: Home / Self Care | Payer: Self-pay | Source: Ambulatory Visit | Attending: Neurological Surgery

## 2022-07-29 ENCOUNTER — Ambulatory Visit (HOSPITAL_COMMUNITY): Payer: Medicaid Other

## 2022-07-29 ENCOUNTER — Other Ambulatory Visit: Payer: Self-pay

## 2022-07-29 ENCOUNTER — Ambulatory Visit (HOSPITAL_BASED_OUTPATIENT_CLINIC_OR_DEPARTMENT_OTHER): Payer: Medicaid Other | Admitting: Certified Registered"

## 2022-07-29 ENCOUNTER — Inpatient Hospital Stay (HOSPITAL_COMMUNITY)
Admission: RE | Admit: 2022-07-29 | Discharge: 2022-07-31 | DRG: 472 | Disposition: A | Discharge status: Home / Self Care | Payer: Medicaid Other | Source: Ambulatory Visit | Attending: Neurological Surgery | Admitting: Neurological Surgery

## 2022-07-29 ENCOUNTER — Encounter (HOSPITAL_COMMUNITY): Payer: Self-pay | Admitting: Neurological Surgery

## 2022-07-29 ENCOUNTER — Ambulatory Visit (HOSPITAL_COMMUNITY): Payer: Medicaid Other | Admitting: Vascular Surgery

## 2022-07-29 DIAGNOSIS — E119 Type 2 diabetes mellitus without complications: Secondary | ICD-10-CM

## 2022-07-29 DIAGNOSIS — Z886 Allergy status to analgesic agent status: Secondary | ICD-10-CM

## 2022-07-29 DIAGNOSIS — Z981 Arthrodesis status: Principal | ICD-10-CM

## 2022-07-29 DIAGNOSIS — M5001 Cervical disc disorder with myelopathy,  high cervical region: Secondary | ICD-10-CM | POA: Diagnosis present

## 2022-07-29 DIAGNOSIS — Z888 Allergy status to other drugs, medicaments and biological substances status: Secondary | ICD-10-CM

## 2022-07-29 DIAGNOSIS — M069 Rheumatoid arthritis, unspecified: Secondary | ICD-10-CM | POA: Diagnosis present

## 2022-07-29 DIAGNOSIS — E1122 Type 2 diabetes mellitus with diabetic chronic kidney disease: Secondary | ICD-10-CM | POA: Diagnosis present

## 2022-07-29 DIAGNOSIS — Z7982 Long term (current) use of aspirin: Secondary | ICD-10-CM

## 2022-07-29 DIAGNOSIS — M4712 Other spondylosis with myelopathy, cervical region: Secondary | ICD-10-CM

## 2022-07-29 DIAGNOSIS — M4802 Spinal stenosis, cervical region: Secondary | ICD-10-CM | POA: Diagnosis present

## 2022-07-29 DIAGNOSIS — I129 Hypertensive chronic kidney disease with stage 1 through stage 4 chronic kidney disease, or unspecified chronic kidney disease: Secondary | ICD-10-CM | POA: Diagnosis present

## 2022-07-29 DIAGNOSIS — Z79899 Other long term (current) drug therapy: Secondary | ICD-10-CM

## 2022-07-29 DIAGNOSIS — K900 Celiac disease: Secondary | ICD-10-CM | POA: Diagnosis present

## 2022-07-29 DIAGNOSIS — J45909 Unspecified asthma, uncomplicated: Secondary | ICD-10-CM | POA: Diagnosis present

## 2022-07-29 DIAGNOSIS — Z91018 Allergy to other foods: Secondary | ICD-10-CM

## 2022-07-29 DIAGNOSIS — K219 Gastro-esophageal reflux disease without esophagitis: Secondary | ICD-10-CM | POA: Diagnosis present

## 2022-07-29 DIAGNOSIS — Z7951 Long term (current) use of inhaled steroids: Secondary | ICD-10-CM

## 2022-07-29 DIAGNOSIS — N183 Chronic kidney disease, stage 3 unspecified: Secondary | ICD-10-CM | POA: Diagnosis present

## 2022-07-29 DIAGNOSIS — G4730 Sleep apnea, unspecified: Secondary | ICD-10-CM | POA: Diagnosis present

## 2022-07-29 DIAGNOSIS — F32A Depression, unspecified: Secondary | ICD-10-CM | POA: Diagnosis present

## 2022-07-29 DIAGNOSIS — Z7985 Long-term (current) use of injectable non-insulin antidiabetic drugs: Secondary | ICD-10-CM

## 2022-07-29 DIAGNOSIS — Z91048 Other nonmedicinal substance allergy status: Secondary | ICD-10-CM

## 2022-07-29 DIAGNOSIS — Z885 Allergy status to narcotic agent status: Secondary | ICD-10-CM

## 2022-07-29 DIAGNOSIS — Z87891 Personal history of nicotine dependence: Secondary | ICD-10-CM

## 2022-07-29 DIAGNOSIS — Z880 Allergy status to penicillin: Secondary | ICD-10-CM

## 2022-07-29 HISTORY — PX: ANTERIOR CERVICAL DECOMP/DISCECTOMY FUSION: SHX1161

## 2022-07-29 LAB — ABO/RH: ABO/RH(D): A POS

## 2022-07-29 LAB — GLUCOSE, CAPILLARY
Glucose-Capillary: 88 mg/dL (ref 70–99)
Glucose-Capillary: 144 mg/dL — ABNORMAL HIGH (ref 70–99)
Glucose-Capillary: 121 mg/dL — ABNORMAL HIGH (ref 70–99)
Glucose-Capillary: 102 mg/dL — ABNORMAL HIGH (ref 70–99)

## 2022-07-29 LAB — TYPE AND SCREEN
ABO/RH(D): A POS
Antibody Screen: NEGATIVE

## 2022-07-29 SURGERY — ANTERIOR CERVICAL DECOMPRESSION/DISCECTOMY FUSION 3 LEVELS
Anesthesia: General | Site: Spine Cervical

## 2022-07-29 MED ORDER — SODIUM CHLORIDE 0.9% FLUSH: 3.0000 mL | INTRAVENOUS | Status: DC | PRN

## 2022-07-29 MED ORDER — SUGAMMADEX SODIUM 200 MG/2ML IV SOLN
INTRAVENOUS | Status: DC | PRN
  Administered 2022-07-29: 200 mg via INTRAVENOUS

## 2022-07-29 MED ORDER — POTASSIUM CHLORIDE IN NACL 20-0.9 MEQ/L-% IV SOLN: INTRAVENOUS | Status: DC

## 2022-07-29 MED ORDER — HYDROMORPHONE HCL 1 MG/ML IJ SOLN
INTRAMUSCULAR | Status: AC
  Filled 2022-07-29: qty 1

## 2022-07-29 MED ORDER — SENNA 8.6 MG PO TABS
1.0000 | ORAL_TABLET | Freq: Two times a day (BID) | ORAL | Status: DC
  Administered 2022-07-30 (×2): 8.6 mg via ORAL
  Filled 2022-07-29 (×4): qty 1

## 2022-07-29 MED ORDER — ONDANSETRON HCL 4 MG/2ML IJ SOLN
INTRAMUSCULAR | Status: DC | PRN
  Administered 2022-07-29: 4 mg via INTRAVENOUS

## 2022-07-29 MED ORDER — ROCURONIUM BROMIDE 10 MG/ML (PF) SYRINGE
PREFILLED_SYRINGE | INTRAVENOUS | Status: DC | PRN
  Administered 2022-07-29: 20 mg via INTRAVENOUS
  Administered 2022-07-29: 10 mg via INTRAVENOUS
  Administered 2022-07-29: 60 mg via INTRAVENOUS

## 2022-07-29 MED ORDER — INSULIN ASPART 100 UNIT/ML IJ SOLN: 0.0000 [IU] | INTRAMUSCULAR | Status: DC | PRN

## 2022-07-29 MED ORDER — AMISULPRIDE (ANTIEMETIC) 5 MG/2ML IV SOLN
INTRAVENOUS | Status: AC
  Filled 2022-07-29: qty 4

## 2022-07-29 MED ORDER — MOMETASONE FURO-FORMOTEROL FUM 200-5 MCG/ACT IN AERO
2.0000 | INHALATION_SPRAY | Freq: Two times a day (BID) | RESPIRATORY_TRACT | Status: DC
  Administered 2022-07-30 (×2): 2 via RESPIRATORY_TRACT
  Filled 2022-07-29: qty 8.8

## 2022-07-29 MED ORDER — SODIUM CHLORIDE 0.9 % IV SOLN
250.0000 mL | INTRAVENOUS | Status: DC
  Administered 2022-07-29: 250 mL via INTRAVENOUS

## 2022-07-29 MED ORDER — MENTHOL 3 MG MT LOZG
1.0000 | LOZENGE | OROMUCOSAL | Status: DC | PRN
  Administered 2022-07-29: 3 mg via ORAL
  Filled 2022-07-29 (×2): qty 9

## 2022-07-29 MED ORDER — THROMBIN 5000 UNITS EX SOLR
CUTANEOUS | Status: AC
  Filled 2022-07-29: qty 5000

## 2022-07-29 MED ORDER — PROPOFOL 10 MG/ML IV BOLUS
INTRAVENOUS | Status: AC
  Filled 2022-07-29: qty 20

## 2022-07-29 MED ORDER — ONDANSETRON HCL 4 MG/2ML IJ SOLN: 4.0000 mg | Freq: Four times a day (QID) | INTRAMUSCULAR | Status: DC | PRN

## 2022-07-29 MED ORDER — ONDANSETRON HCL 4 MG PO TABS: 4.0000 mg | ORAL_TABLET | Freq: Four times a day (QID) | ORAL | Status: DC | PRN

## 2022-07-29 MED ORDER — GABAPENTIN 300 MG PO CAPS
300.0000 mg | ORAL_CAPSULE | ORAL | Status: AC
  Administered 2022-07-29: 300 mg via ORAL
  Filled 2022-07-29: qty 1

## 2022-07-29 MED ORDER — FENTANYL CITRATE (PF) 250 MCG/5ML IJ SOLN
INTRAMUSCULAR | Status: DC | PRN
  Administered 2022-07-29: 100 ug via INTRAVENOUS

## 2022-07-29 MED ORDER — THROMBIN (RECOMBINANT) 5000 UNITS EX SOLR: CUTANEOUS | Status: DC | PRN

## 2022-07-29 MED ORDER — THROMBIN 5000 UNITS EX SOLR: OROMUCOSAL | Status: DC | PRN

## 2022-07-29 MED ORDER — MORPHINE SULFATE (PF) 2 MG/ML IV SOLN: 2.0000 mg | INTRAVENOUS | Status: DC | PRN

## 2022-07-29 MED ORDER — ROCURONIUM BROMIDE 10 MG/ML (PF) SYRINGE
PREFILLED_SYRINGE | INTRAVENOUS | Status: AC
  Filled 2022-07-29: qty 10

## 2022-07-29 MED ORDER — FENTANYL CITRATE (PF) 250 MCG/5ML IJ SOLN
INTRAMUSCULAR | Status: AC
  Filled 2022-07-29: qty 5

## 2022-07-29 MED ORDER — ONDANSETRON HCL 4 MG/2ML IJ SOLN
INTRAMUSCULAR | Status: AC
  Filled 2022-07-29: qty 2

## 2022-07-29 MED ORDER — CYCLOBENZAPRINE HCL 10 MG PO TABS
10.0000 mg | ORAL_TABLET | Freq: Three times a day (TID) | ORAL | Status: DC | PRN
  Administered 2022-07-30 – 2022-07-31 (×2): 10 mg via ORAL
  Filled 2022-07-29 (×2): qty 1

## 2022-07-29 MED ORDER — AMISULPRIDE (ANTIEMETIC) 5 MG/2ML IV SOLN
10.0000 mg | Freq: Once | INTRAVENOUS | Status: AC | PRN
  Administered 2022-07-29: 10 mg via INTRAVENOUS

## 2022-07-29 MED ORDER — DEXAMETHASONE SODIUM PHOSPHATE 10 MG/ML IJ SOLN
INTRAMUSCULAR | Status: AC
  Filled 2022-07-29: qty 1

## 2022-07-29 MED ORDER — ACETAMINOPHEN 500 MG PO TABS
1000.0000 mg | ORAL_TABLET | Freq: Once | ORAL | Status: AC
  Administered 2022-07-29: 1000 mg via ORAL

## 2022-07-29 MED ORDER — CHLORHEXIDINE GLUCONATE CLOTH 2 % EX PADS: 6.0000 | MEDICATED_PAD | Freq: Once | CUTANEOUS | Status: DC

## 2022-07-29 MED ORDER — THROMBIN 5000 UNITS EX SOLR
CUTANEOUS | Status: AC
  Filled 2022-07-29: qty 10000

## 2022-07-29 MED ORDER — LIDOCAINE 2% (20 MG/ML) 5 ML SYRINGE
INTRAMUSCULAR | Status: AC
  Filled 2022-07-29: qty 5

## 2022-07-29 MED ORDER — ONDANSETRON HCL 4 MG/2ML IJ SOLN: 4.0000 mg | Freq: Once | INTRAMUSCULAR | Status: DC | PRN

## 2022-07-29 MED ORDER — PHENYLEPHRINE 80 MCG/ML (10ML) SYRINGE FOR IV PUSH (FOR BLOOD PRESSURE SUPPORT)
PREFILLED_SYRINGE | INTRAVENOUS | Status: DC | PRN
  Administered 2022-07-29 (×3): 160 ug via INTRAVENOUS

## 2022-07-29 MED ORDER — LACTATED RINGERS IV SOLN: INTRAVENOUS | Status: DC

## 2022-07-29 MED ORDER — ORAL CARE MOUTH RINSE: 15.0000 mL | Freq: Once | OROMUCOSAL | Status: AC

## 2022-07-29 MED ORDER — 0.9 % SODIUM CHLORIDE (POUR BTL) OPTIME
TOPICAL | Status: DC | PRN
  Administered 2022-07-29: 1000 mL

## 2022-07-29 MED ORDER — OXYCODONE HCL 5 MG PO TABS: 5.0000 mg | ORAL_TABLET | Freq: Once | ORAL | Status: DC | PRN

## 2022-07-29 MED ORDER — SODIUM CHLORIDE 0.9% FLUSH
3.0000 mL | Freq: Two times a day (BID) | INTRAVENOUS | Status: DC
  Administered 2022-07-30: 3 mL via INTRAVENOUS

## 2022-07-29 MED ORDER — ACETAMINOPHEN 500 MG PO TABS
1000.0000 mg | ORAL_TABLET | ORAL | Status: DC
  Filled 2022-07-29: qty 2

## 2022-07-29 MED ORDER — ACETAMINOPHEN 650 MG RE SUPP: 650.0000 mg | RECTAL | Status: DC | PRN

## 2022-07-29 MED ORDER — VANCOMYCIN HCL IN DEXTROSE 1-5 GM/200ML-% IV SOLN
1000.0000 mg | INTRAVENOUS | Status: AC
  Administered 2022-07-29: 1000 mg via INTRAVENOUS
  Filled 2022-07-29: qty 200

## 2022-07-29 MED ORDER — HYDROXYZINE HCL 50 MG/ML IM SOLN
50.0000 mg | Freq: Four times a day (QID) | INTRAMUSCULAR | Status: DC | PRN
  Administered 2022-07-29 (×2): 50 mg via INTRAMUSCULAR
  Filled 2022-07-29 (×3): qty 1

## 2022-07-29 MED ORDER — DEXAMETHASONE SODIUM PHOSPHATE 4 MG/ML IJ SOLN
4.0000 mg | Freq: Four times a day (QID) | INTRAMUSCULAR | Status: DC
  Administered 2022-07-29 – 2022-07-30 (×4): 4 mg via INTRAVENOUS
  Filled 2022-07-29 (×4): qty 1

## 2022-07-29 MED ORDER — FLUOXETINE HCL 10 MG PO CAPS
10.0000 mg | ORAL_CAPSULE | Freq: Every day | ORAL | Status: DC
  Administered 2022-07-30: 10 mg via ORAL
  Filled 2022-07-29 (×3): qty 1

## 2022-07-29 MED ORDER — INSULIN ASPART 100 UNIT/ML IJ SOLN: 0.0000 [IU] | Freq: Every day | INTRAMUSCULAR | Status: DC

## 2022-07-29 MED ORDER — HYDROMORPHONE HCL 1 MG/ML IJ SOLN
0.2500 mg | INTRAMUSCULAR | Status: DC | PRN
  Administered 2022-07-29 (×4): 0.5 mg via INTRAVENOUS

## 2022-07-29 MED ORDER — MIDAZOLAM HCL 2 MG/2ML IJ SOLN
INTRAMUSCULAR | Status: AC
  Filled 2022-07-29: qty 2

## 2022-07-29 MED ORDER — BUPROPION HCL ER (XL) 300 MG PO TB24
300.0000 mg | ORAL_TABLET | ORAL | Status: DC
  Administered 2022-07-30 – 2022-07-31 (×2): 300 mg via ORAL
  Filled 2022-07-29 (×2): qty 1

## 2022-07-29 MED ORDER — EPHEDRINE SULFATE-NACL 50-0.9 MG/10ML-% IV SOSY
PREFILLED_SYRINGE | INTRAVENOUS | Status: DC | PRN
  Administered 2022-07-29: 5 mg via INTRAVENOUS

## 2022-07-29 MED ORDER — LIDOCAINE 2% (20 MG/ML) 5 ML SYRINGE
INTRAMUSCULAR | Status: DC | PRN
  Administered 2022-07-29: 60 mg via INTRAVENOUS

## 2022-07-29 MED ORDER — CHLORHEXIDINE GLUCONATE 0.12 % MT SOLN
15.0000 mL | Freq: Once | OROMUCOSAL | Status: AC
  Administered 2022-07-29: 15 mL via OROMUCOSAL
  Filled 2022-07-29: qty 15

## 2022-07-29 MED ORDER — BUPIVACAINE HCL (PF) 0.25 % IJ SOLN
INTRAMUSCULAR | Status: AC
  Filled 2022-07-29: qty 30

## 2022-07-29 MED ORDER — PHENYLEPHRINE HCL-NACL 20-0.9 MG/250ML-% IV SOLN
INTRAVENOUS | Status: DC | PRN
  Administered 2022-07-29: 30 ug/min via INTRAVENOUS

## 2022-07-29 MED ORDER — ALBUTEROL SULFATE HFA 108 (90 BASE) MCG/ACT IN AERS: 2.0000 | INHALATION_SPRAY | Freq: Four times a day (QID) | RESPIRATORY_TRACT | Status: DC | PRN

## 2022-07-29 MED ORDER — DEXAMETHASONE SODIUM PHOSPHATE 10 MG/ML IJ SOLN
INTRAMUSCULAR | Status: DC | PRN
  Administered 2022-07-29: 5 mg via INTRAVENOUS

## 2022-07-29 MED ORDER — OXYCODONE HCL 5 MG/5ML PO SOLN: 5.0000 mg | Freq: Once | ORAL | Status: DC | PRN

## 2022-07-29 MED ORDER — BUPIVACAINE HCL (PF) 0.25 % IJ SOLN
INTRAMUSCULAR | Status: DC | PRN
  Administered 2022-07-29: 5 mL

## 2022-07-29 MED ORDER — HYDROCODONE-ACETAMINOPHEN 5-325 MG PO TABS
1.0000 | ORAL_TABLET | ORAL | Status: DC | PRN
  Administered 2022-07-29 – 2022-07-31 (×8): 1 via ORAL
  Filled 2022-07-29 (×8): qty 1

## 2022-07-29 MED ORDER — PHENOL 1.4 % MT LIQD
1.0000 | OROMUCOSAL | Status: DC | PRN
  Administered 2022-07-30: 1 via OROMUCOSAL
  Filled 2022-07-29: qty 177

## 2022-07-29 MED ORDER — KETAMINE HCL 50 MG/5ML IJ SOSY
PREFILLED_SYRINGE | INTRAMUSCULAR | Status: AC
  Filled 2022-07-29: qty 5

## 2022-07-29 MED ORDER — PROPOFOL 10 MG/ML IV BOLUS
INTRAVENOUS | Status: DC | PRN
  Administered 2022-07-29: 140 mg via INTRAVENOUS

## 2022-07-29 MED ORDER — ONDANSETRON 4 MG PO TBDP
8.0000 mg | ORAL_TABLET | Freq: Three times a day (TID) | ORAL | Status: DC | PRN
  Administered 2022-07-30 (×3): 8 mg via ORAL
  Filled 2022-07-29 (×5): qty 2

## 2022-07-29 MED ORDER — MIDAZOLAM HCL 2 MG/2ML IJ SOLN
INTRAMUSCULAR | Status: DC | PRN
  Administered 2022-07-29: 2 mg via INTRAVENOUS

## 2022-07-29 MED ORDER — ACETAMINOPHEN 325 MG PO TABS
650.0000 mg | ORAL_TABLET | ORAL | Status: DC | PRN
  Administered 2022-07-30: 650 mg via ORAL
  Filled 2022-07-29: qty 2

## 2022-07-29 MED ORDER — ALBUTEROL SULFATE (2.5 MG/3ML) 0.083% IN NEBU: 2.5000 mg | INHALATION_SOLUTION | Freq: Four times a day (QID) | RESPIRATORY_TRACT | Status: DC | PRN

## 2022-07-29 MED ORDER — KETAMINE HCL 10 MG/ML IJ SOLN
INTRAMUSCULAR | Status: DC | PRN
  Administered 2022-07-29: 25 mg via INTRAVENOUS
  Administered 2022-07-29: 15 mg via INTRAVENOUS
  Administered 2022-07-29: 10 mg via INTRAVENOUS

## 2022-07-29 MED ORDER — VANCOMYCIN HCL IN DEXTROSE 1-5 GM/200ML-% IV SOLN
1000.0000 mg | Freq: Once | INTRAVENOUS | Status: AC
  Administered 2022-07-29: 1000 mg via INTRAVENOUS
  Filled 2022-07-29: qty 200

## 2022-07-29 MED ORDER — DEXAMETHASONE 4 MG PO TABS
4.0000 mg | ORAL_TABLET | Freq: Four times a day (QID) | ORAL | Status: DC
  Administered 2022-07-30 – 2022-07-31 (×3): 4 mg via ORAL
  Filled 2022-07-29 (×5): qty 1

## 2022-07-29 SURGICAL SUPPLY — 48 items
BAG COUNTER SPONGE SURGICOUNT (BAG) ×1 IMPLANT
BAND RUBBER #18 3X1/16 STRL (MISCELLANEOUS) ×2 IMPLANT
BASKET BONE COLLECTION (BASKET) IMPLANT
BENZOIN TINCTURE PRP APPL 2/3 (GAUZE/BANDAGES/DRESSINGS) ×1 IMPLANT
BIT DRILL 2.3X12 (BIT) IMPLANT
BUR CARBIDE MATCH 3.0 (BURR) ×1 IMPLANT
CANISTER SUCT 3000ML PPV (MISCELLANEOUS) ×1 IMPLANT
DERMABOND ADVANCED .7 DNX12 (GAUZE/BANDAGES/DRESSINGS) IMPLANT
DRAPE C-ARM 42X72 X-RAY (DRAPES) ×2 IMPLANT
DRAPE LAPAROTOMY 100X72 PEDS (DRAPES) ×1 IMPLANT
DRAPE MICROSCOPE SLANT 54X150 (MISCELLANEOUS) ×1 IMPLANT
DURAPREP 6ML APPLICATOR 50/CS (WOUND CARE) ×1 IMPLANT
ELECT COATED BLADE 2.86 ST (ELECTRODE) ×1 IMPLANT
ELECT REM PT RETURN 9FT ADLT (ELECTROSURGICAL) ×1
ELECTRODE REM PT RTRN 9FT ADLT (ELECTROSURGICAL) ×1 IMPLANT
GAUZE 4X4 16PLY ~~LOC~~+RFID DBL (SPONGE) IMPLANT
GLOVE BIO SURGEON STRL SZ7 (GLOVE) IMPLANT
GLOVE BIO SURGEON STRL SZ8 (GLOVE) ×1 IMPLANT
GLOVE BIOGEL PI IND STRL 7.0 (GLOVE) IMPLANT
GOWN STRL REUS W/ TWL LRG LVL3 (GOWN DISPOSABLE) IMPLANT
GOWN STRL REUS W/ TWL XL LVL3 (GOWN DISPOSABLE) ×1 IMPLANT
GOWN STRL REUS W/TWL 2XL LVL3 (GOWN DISPOSABLE) IMPLANT
GOWN STRL REUS W/TWL LRG LVL3 (GOWN DISPOSABLE)
GOWN STRL REUS W/TWL XL LVL3 (GOWN DISPOSABLE) ×1
GRAFT IFACTOR PEPTIDE 2.5CC (Graft) IMPLANT
HEMOSTAT POWDER KIT SURGIFOAM (HEMOSTASIS) ×1 IMPLANT
KIT BASIN OR (CUSTOM PROCEDURE TRAY) ×1 IMPLANT
KIT TURNOVER KIT B (KITS) ×1 IMPLANT
NDL HYPO 25X1 1.5 SAFETY (NEEDLE) ×1 IMPLANT
NDL SPNL 20GX3.5 QUINCKE YW (NEEDLE) ×1 IMPLANT
NEEDLE HYPO 25X1 1.5 SAFETY (NEEDLE) ×1 IMPLANT
NEEDLE SPNL 20GX3.5 QUINCKE YW (NEEDLE) ×1 IMPLANT
NS IRRIG 1000ML POUR BTL (IV SOLUTION) ×1 IMPLANT
PACK LAMINECTOMY NEURO (CUSTOM PROCEDURE TRAY) ×1 IMPLANT
PAD ARMBOARD 7.5X6 YLW CONV (MISCELLANEOUS) ×1 IMPLANT
PIN DISTRACTION 14MM (PIN) IMPLANT
PLATE ACP 62X3 LVL NS LF SPNE (Plate) IMPLANT
PLATE ACP INSIGNIA 62 3L (Plate) ×1 IMPLANT
SCREW VA SINGLE LEAD 4X16 (Screw) IMPLANT
SPACER IDENTITI 7X16X14 7D (Spacer) IMPLANT
SPONGE INTESTINAL PEANUT (DISPOSABLE) ×1 IMPLANT
SPONGE SURGIFOAM ABS GEL 100 (HEMOSTASIS) IMPLANT
STRIP CLOSURE SKIN 1/2X4 (GAUZE/BANDAGES/DRESSINGS) ×1 IMPLANT
SUT VIC AB 3-0 SH 8-18 (SUTURE) ×1 IMPLANT
SUT VICRYL 4-0 PS2 18IN ABS (SUTURE) IMPLANT
TOWEL GREEN STERILE (TOWEL DISPOSABLE) ×1 IMPLANT
TOWEL GREEN STERILE FF (TOWEL DISPOSABLE) ×1 IMPLANT
WATER STERILE IRR 1000ML POUR (IV SOLUTION) ×1 IMPLANT

## 2022-07-29 NOTE — Transfer of Care (Signed)
Immediate Anesthesia Transfer of Care Note  Patient: Savannah Clark  Procedure(s) Performed: CERVICAL THREE-FOUR, CERVICAL FOUR-FIVE, CERVICAL FIVE-SIX ANTERIOR CERVICAL DECOMPRESSION/DISCECTOMY FUSION (Spine Cervical)  Patient Location: PACU  Anesthesia Type:General  Level of Consciousness: sedated  Airway & Oxygen Therapy: Patient Spontanous Breathing  Post-op Assessment: Report given to RN and Post -op Vital signs reviewed and stable  Post vital signs: Reviewed and stable  Last Vitals:  Vitals Value Taken Time  BP 142/88 07/29/22 1143  Temp    Pulse 88 07/29/22 1144  Resp    SpO2 99 % 07/29/22 1144  Vitals shown include unvalidated device data.  Last Pain:  Vitals:   07/29/22 0723  TempSrc: Oral  PainSc:          Complications: No notable events documented.

## 2022-07-29 NOTE — Progress Notes (Signed)
Pharmacy Antibiotic Note  Savannah Clark is a 62 y.o. female admitted on 07/29/2022 with surgical prophylaxis.  Pharmacy has been consulted for vancomcyin dosing. No drain placement.  Plan: Vancomycin 1000 mg IV x1 dose   Allergies  Allergen Reactions   Adhesive [Tape] Hives and Other (See Comments)    Blisters    Amoxicillin-Pot Clavulanate Nausea And Vomiting   Aspirin Other (See Comments)    Gi upset   Farxiga [Dapagliflozin] Diarrhea and Nausea And Vomiting    shakes   Gluten Meal    Januvia [Sitagliptin] Diarrhea and Nausea And Vomiting    shakes   Lactose Intolerance (Gi)    Morphine Nausea Only    Pt states morphine makes "her really dizzy, nauseated, and makes her head swim."   Wheat Diarrhea, Nausea Only and Other (See Comments)    Joints pain    Thank you for allowing pharmacy to be a part of this patient's care.  Ardyth Harps, PharmD Clinical Pharmacist

## 2022-07-29 NOTE — H&P (Signed)
Subjective:   Patient is a 62 y.o. female admitted for cervical stenosis. The patient first presented to me with complaints of neck pain, shooting pains in the arm(s), and numbness of the arm(s). Onset of symptoms was several months ago. The pain is described as aching and occurs intermittently. The pain is rated severe, and is located in the neck and radiates to the arms. The symptoms have been progressive. Symptoms are exacerbated by extending head backwards, and are relieved by none.  Previous work up includes MRI of cervical spine, results: spinal stenosis.  Past Medical History:  Diagnosis Date   Anemia    Anxiety    Arthritis    RA and OA   Asthma    Bronchitis    Celiac disease    Chronic leukopenia    Depression    Diabetes mellitus without complication (HCC)    Type 2   GERD (gastroesophageal reflux disease)    Hypertension    Hx with increased weight   Night terror disorder    Pancreatitis, chronic (HCC)    PTSD (post-traumatic stress disorder)    Renal disorder    CKD 3   Sleep apnea     Past Surgical History:  Procedure Laterality Date   BREAST SURGERY     Breast Augmentation   COLONOSCOPY  2022   ESOPHAGOGASTRODUODENOSCOPY ENDOSCOPY  2022   FOOT SURGERY Bilateral    Hammer Toe   RHINOPLASTY     TONSILLECTOMY  1970    Allergies  Allergen Reactions   Adhesive [Tape] Hives and Other (See Comments)    Blisters    Amoxicillin-Pot Clavulanate Nausea And Vomiting   Aspirin Other (See Comments)    Gi upset   Farxiga [Dapagliflozin] Diarrhea and Nausea And Vomiting    shakes   Gluten Meal    Januvia [Sitagliptin] Diarrhea and Nausea And Vomiting    shakes   Lactose Intolerance (Gi)    Morphine Nausea Only    Pt states morphine makes "her really dizzy, nauseated, and makes her head swim."   Wheat Diarrhea, Nausea Only and Other (See Comments)    Joints pain    Social History   Tobacco Use   Smoking status: Former    Types: Cigarettes    Quit date:  06/09/2007    Years since quitting: 15.1   Smokeless tobacco: Never  Substance Use Topics   Alcohol use: Yes    Comment: rare    History reviewed. No pertinent family history. Prior to Admission medications   Medication Sig Start Date End Date Taking? Authorizing Provider  albuterol (PROVENTIL HFA;VENTOLIN HFA) 108 (90 BASE) MCG/ACT inhaler Inhale 2 puffs into the lungs every 6 (six) hours as needed for shortness of breath or wheezing.   Yes [provider]  aspirin EC 81 MG tablet Take 81 mg by mouth daily.   Yes [provider]  Azelastine HCl 137 MCG/SPRAY SOLN Place 1-2 sprays into both nostrils daily as needed (allergies). 01/23/21  Yes [provider]  Biotin 5000 MCG CAPS Take 5,000 mcg by mouth daily.   Yes [provider]  buPROPion (WELLBUTRIN XL) 300 MG 24 hr tablet TAKE 1 TABLET (300 MG TOTAL) BY MOUTH EVERY MORNING. 06/23/21  Yes Eulis Canner E, NP  CALCIUM PO Take 1 tablet by mouth daily.   Yes [provider]  Dupilumab (DUPIXENT) 300 MG/2ML SOPN Inject 300 mg into the skin every 14 (fourteen) days. 02/11/21  Yes [provider]  FLUoxetine (PROZAC) 10  MG capsule Take 1 capsule (10 mg total) by mouth daily. 01/23/21  Yes Eulis Canner E, NP  Fluticasone-Salmeterol (ADVAIR) 250-50 MCG/DOSE AEPB Inhale 1 puff into the lungs 2 (two) times daily.   Yes [provider]  hydrOXYzine (ATARAX) 25 MG tablet TAKE 1 TABLET BY MOUTH THREE TIMES A DAY AS NEEDED 05/04/22  Yes Salley Slaughter, NP  LINZESS 145 MCG CAPS capsule Take 145 mcg by mouth daily as needed (constipation). 06/06/22  Yes [provider]  ondansetron (ZOFRAN-ODT) 8 MG disintegrating tablet Take 8 mg by mouth every 4 (four) hours as needed for vomiting or nausea. 06/20/22  Yes [provider]  tirzepatide Darcel Bayley) 10 MG/0.5ML Pen Inject 1 pen (10 MG) into the skin once a week 01/01/22  Yes   traZODone (DESYREL) 50 MG tablet Take 12.5 mg  by mouth at bedtime as needed for sleep. 11/04/21  Yes [provider]  cyclobenzaprine (FLEXERIL) 10 MG tablet Take 10 mg by mouth 3 (three) times daily as needed for muscle spasms. 10/21/21   [provider]  dicyclomine (BENTYL) 20 MG tablet Take 1 tablet (20 mg total) by mouth 2 (two) times daily. Patient not taking: Reported on 07/20/2022 02/14/22   Tedd Sias, PA  famotidine (PEPCID) 20 MG tablet Take 1 tablet (20 mg total) by mouth 2 (two) times daily. Patient not taking: Reported on 07/20/2022 02/14/22   Tedd Sias, PA  fluticasone (FLONASE) 50 MCG/ACT nasal spray Place 1 spray into both nostrils daily for 5 doses. Patient taking differently: Place 1 spray into both nostrils daily as needed for allergies. 07/04/18 07/20/22  Janeece Fitting, PA-C  hydroquinone 4 % cream hydroquinone 4 % topical cream  APPLY TOPICALLY 2 TIMES DAILY. NIGHTLY TO DARK SPOTS ONLY.    [provider]  ondansetron (ZOFRAN-ODT) 4 MG disintegrating tablet Take 1 tablet (4 mg total) by mouth every 8 (eight) hours as needed for nausea or vomiting. Patient not taking: Reported on 07/20/2022 02/14/22   Tedd Sias, PA  promethazine (PHENERGAN) 12.5 MG tablet Take 12.5 mg by mouth every 6 (six) hours as needed for nausea or vomiting.    [provider]     Review of Systems  Positive ROS: neg  All other systems have been reviewed and were otherwise negative with the exception of those mentioned in the HPI and as above.  Objective: Vital signs in last 24 hours: Temp:  [98 F (36.7 C)] 98 F (36.7 C) (02/21 0723) Pulse Rate:  [76] 76 (02/21 0723) Resp:  [16] 16 (02/21 0723) BP: (152-162)/(97-100) 152/97 (02/21 0738) SpO2:  [100 %] 100 % (02/21 0723) Weight:  [55.1 kg] 55.1 kg (02/21 0644)  General Appearance: Alert, cooperative, no distress, appears stated age Head: Normocephalic, without obvious abnormality, atraumatic Eyes: PERRL, conjunctiva/corneas clear, EOM's intact       Neck: Supple, symmetrical, trachea midline, Back: Symmetric, no curvature, ROM normal, no CVA tenderness Lungs:  respirations unlabored Heart: Regular rate and rhythm Abdomen: Soft, non-tender Extremities: Extremities normal, atraumatic, no cyanosis or edema Pulses: 2+ and symmetric all extremities Skin: Skin color, texture, turgor normal, no rashes or lesions  NEUROLOGIC:  Mental status: Alert and oriented x4, no aphasia, good attention span, fund of knowledge and memory  Motor Exam - grossly normal Sensory Exam - grossly normal Reflexes: 1+ Coordination - grossly normal Gait - grossly normal Balance - grossly normal Cranial Nerves: I: smell Not tested  II: visual acuity  OS: nl  OD: nl  II: visual fields Full to confrontation  II: pupils Equal, round, reactive to light  III,VII: ptosis None  III,IV,VI: extraocular muscles  Full ROM  V: mastication Normal  V: facial light touch sensation  Normal  V,VII: corneal reflex  Present  VII: facial muscle function - upper  Normal  VII: facial muscle function - lower Normal  VIII: hearing Not tested  IX: soft palate elevation  Normal  IX,X: gag reflex Present  XI: trapezius strength  5/5  XI: sternocleidomastoid strength 5/5  XI: neck flexion strength  5/5  XII: tongue strength  Normal    Data Review Lab Results  Component Value Date   WBC 2.6 (L) 07/22/2022   HGB 12.4 07/22/2022   HCT 36.2 07/22/2022   MCV 92.1 07/22/2022   PLT 253 07/22/2022   Lab Results  Component Value Date   NA 139 07/22/2022   K 3.9 07/22/2022   CL 103 07/22/2022   CO2 25 07/22/2022   BUN 10 07/22/2022   CREATININE 0.97 07/22/2022   GLUCOSE 86 07/22/2022   Lab Results  Component Value Date   INR 1.1 07/22/2022    Assessment:   Cervical neck pain with herniated nucleus pulposus/ spondylosis/ stenosis at C3-6. Estimated body mass index is 19.03 kg/m as calculated from the following:   Height as of this encounter: 5' 7"$  (1.702  m).   Weight as of this encounter: 55.1 kg.  Patient has failed conservative therapy. Planned surgery : ACDF C3-4 C4-5 C5-6  Plan:   I explained the condition and procedure to the patient and answered any questions.  Patient wishes to proceed with procedure as planned. Understands risks/ benefits/ and expected or typical outcomes.  Eustace Moore 07/29/2022 8:05 AM

## 2022-07-29 NOTE — Op Note (Signed)
07/29/2022  11:31 AM  PATIENT:  Savannah Clark  62 y.o. 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 Decompressive anterior cervical discectomy C3-4 C4-5 C5-6, 2. Anterior cervical arthrodesis C3-4 C4-5 C5-6 utilizing a porous titanium interbody cage packed with locally harvested morcellized autologous bone graft and I factor, 3. Anterior cervical plating C3-C6 inclusive utilizing a ATEC plate  SURGEON:  Sherley Bounds, MD  ASSISTANTS: Dr. Christella Noa  ANESTHESIA:   General  EBL: 50 ml  Total I/O In: 1000 [I.V.:1000] Out: 150 [Blood:150]  BLOOD ADMINISTERED: none  DRAINS: none  SPECIMEN:  none  INDICATION FOR PROCEDURE: This patient presented with neck pain and hyperreflexia. Imaging showed cervical spondylosis with stenosis. The patient tried conservative measures without relief. Pain was debilitating. Recommended ACDF with plating. Patient understood the risks, benefits, and alternatives and potential outcomes and wished to proceed.  PROCEDURE DETAILS: Patient was brought to the operating room placed under general endotracheal anesthesia. Patient was placed in the supine position on the operating room table. The neck was prepped with Duraprep and draped in a sterile fashion.   Three cc of local anesthesia was injected and a transverse incision was made on the right side of the neck.  Dissection was carried down thru the subcutaneous tissue and the platysma was  elevated, opened, and undermined with Metzenbaum scissors.  Dissection was then carried out thru an avascular plane leaving the sternocleidomastoid carotid artery and jugular vein laterally and the trachea and esophagus medially with the assistance of my nurse practitioner. The ventral aspect of the vertebral column was identified and a localizing x-ray was taken. The C4-5 level was identified and all in the  room agreed with the level. The longus colli muscles were then elevated and the retractor was placed with the assistance of my nurse practitioner to expose C3-4 C4-5 and C5-6. The annulus was incised and the disc space entered. Discectomy was performed with micro-curettes and pituitary rongeurs. I then used the high-speed drill to drill the endplates down to the level of the posterior longitudinal ligament. The drill shavings were saved in a mucous trap for later arthrodesis. The operating microscope was draped and brought into the field provided additional magnification, illumination and visualization. Discectomy was continued posteriorly thru the disc space. Posterior longitudinal ligament was opened with a nerve hook, and then removed along with disc herniation and osteophytes, decompressing the spinal canal and thecal sac. We then continued to remove osteophytic overgrowth and disc material decompressing the neural foramina and exiting nerve roots bilaterally. The scope was angled up and down to help decompress and undercut the vertebral bodies.  Dr. Christella Noa assisted in the decompression.  Once the decompression was completed we could pass a nerve hook circumferentially to assure adequate decompression in the midline and in the neural foramina. So by both visualization and palpation we felt we had an adequate decompression of the neural elements. We then measured the height of the intravertebral disc space and selected a 7 millimeter PTI interbody cage packed with autograft and I factor for each level. It was then gently positioned in the intravertebral disc space(s) and countersunk. I then used a 62 mm plate and placed 16 mm variable angle screws into the vertebral bodies of each level and locked them into position. The wound was irrigated with bacitracin solution, checked for hemostasis which was established and confirmed. Once meticulous hemostasis was achieved, we then proceeded  with closure with the  assistance of my nurse practitioner. The platysma was closed with interrupted 3-0 undyed Vicryl suture, the subcuticular layer was closed with interrupted 3-0 undyed Vicryl suture. The skin edges were approximated with steristrips. The drapes were removed. A sterile dressing was applied. The patient was then awakened from general anesthesia and transferred to the recovery room in stable condition. At the end of the procedure all sponge, needle and instrument counts were correct.   PLAN OF CARE: Admit for overnight observation  PATIENT DISPOSITION:  PACU - hemodynamically stable.   Delay start of Pharmacological VTE agent (>24hrs) due to surgical blood loss or risk of bleeding:  yes

## 2022-07-29 NOTE — Anesthesia Postprocedure Evaluation (Signed)
Anesthesia Post Note  Patient: Savannah Clark  Procedure(s) Performed: CERVICAL THREE-FOUR, CERVICAL FOUR-FIVE, CERVICAL FIVE-SIX ANTERIOR CERVICAL DECOMPRESSION/DISCECTOMY FUSION (Spine Cervical)     Patient location during evaluation: PACU Anesthesia Type: General Level of consciousness: awake and alert, oriented and patient cooperative Pain management: pain level controlled Vital Signs Assessment: post-procedure vital signs reviewed and stable Respiratory status: spontaneous breathing, nonlabored ventilation and respiratory function stable Cardiovascular status: blood pressure returned to baseline and stable Postop Assessment: no apparent nausea or vomiting Anesthetic complications: no   No notable events documented.  Last Vitals:  Vitals:   07/29/22 1417 07/29/22 1549  BP: (!) 153/87 134/86  Pulse: 80 77  Resp: 20 17  Temp: (!) 36.4 C (!) 36.4 C  SpO2: 99% 100%    Last Pain:  Vitals:   07/29/22 1549  TempSrc: Oral  PainSc:    Pain Goal:                   [name]

## 2022-07-29 NOTE — Anesthesia Procedure Notes (Signed)
Procedure Name: Intubation Date/Time: 07/29/2022 8:47 AM  Performed by: Mosetta Pigeon, CRNAPre-anesthesia Checklist: Patient identified, Emergency Drugs available, Suction available and Patient being monitored Patient Re-evaluated:Patient Re-evaluated prior to induction Oxygen Delivery Method: Circle System Utilized Preoxygenation: Pre-oxygenation with 100% oxygen Induction Type: IV induction Ventilation: Mask ventilation without difficulty Laryngoscope Size: Glidescope and 3 Grade View: Grade I Tube type: Oral Tube size: 7.0 mm Number of attempts: 1 Airway Equipment and Method: Video-laryngoscopy and Rigid stylet Placement Confirmation: ETT inserted through vocal cords under direct vision, positive ETCO2 and breath sounds checked- equal and bilateral Secured at: 22 cm Tube secured with: Tape Dental Injury: Teeth and Oropharynx as per pre-operative assessment

## 2022-07-30 DIAGNOSIS — M4712 Other spondylosis with myelopathy, cervical region: Secondary | ICD-10-CM | POA: Diagnosis present

## 2022-07-30 DIAGNOSIS — K900 Celiac disease: Secondary | ICD-10-CM | POA: Diagnosis present

## 2022-07-30 DIAGNOSIS — Z880 Allergy status to penicillin: Secondary | ICD-10-CM | POA: Diagnosis not present

## 2022-07-30 DIAGNOSIS — N183 Chronic kidney disease, stage 3 unspecified: Secondary | ICD-10-CM | POA: Diagnosis present

## 2022-07-30 DIAGNOSIS — Z888 Allergy status to other drugs, medicaments and biological substances status: Secondary | ICD-10-CM | POA: Diagnosis not present

## 2022-07-30 DIAGNOSIS — Z7985 Long-term (current) use of injectable non-insulin antidiabetic drugs: Secondary | ICD-10-CM | POA: Diagnosis not present

## 2022-07-30 DIAGNOSIS — Z7982 Long term (current) use of aspirin: Secondary | ICD-10-CM | POA: Diagnosis not present

## 2022-07-30 DIAGNOSIS — E1122 Type 2 diabetes mellitus with diabetic chronic kidney disease: Secondary | ICD-10-CM | POA: Diagnosis present

## 2022-07-30 DIAGNOSIS — Z91018 Allergy to other foods: Secondary | ICD-10-CM | POA: Diagnosis not present

## 2022-07-30 DIAGNOSIS — M5001 Cervical disc disorder with myelopathy,  high cervical region: Secondary | ICD-10-CM | POA: Diagnosis present

## 2022-07-30 DIAGNOSIS — Z885 Allergy status to narcotic agent status: Secondary | ICD-10-CM | POA: Diagnosis not present

## 2022-07-30 DIAGNOSIS — Z79899 Other long term (current) drug therapy: Secondary | ICD-10-CM | POA: Diagnosis not present

## 2022-07-30 DIAGNOSIS — Z91048 Other nonmedicinal substance allergy status: Secondary | ICD-10-CM | POA: Diagnosis not present

## 2022-07-30 DIAGNOSIS — M069 Rheumatoid arthritis, unspecified: Secondary | ICD-10-CM | POA: Diagnosis present

## 2022-07-30 DIAGNOSIS — I129 Hypertensive chronic kidney disease with stage 1 through stage 4 chronic kidney disease, or unspecified chronic kidney disease: Secondary | ICD-10-CM | POA: Diagnosis present

## 2022-07-30 DIAGNOSIS — Z7951 Long term (current) use of inhaled steroids: Secondary | ICD-10-CM | POA: Diagnosis not present

## 2022-07-30 DIAGNOSIS — F32A Depression, unspecified: Secondary | ICD-10-CM | POA: Diagnosis present

## 2022-07-30 DIAGNOSIS — Z886 Allergy status to analgesic agent status: Secondary | ICD-10-CM | POA: Diagnosis not present

## 2022-07-30 DIAGNOSIS — M4802 Spinal stenosis, cervical region: Secondary | ICD-10-CM | POA: Diagnosis present

## 2022-07-30 DIAGNOSIS — G4730 Sleep apnea, unspecified: Secondary | ICD-10-CM | POA: Diagnosis present

## 2022-07-30 DIAGNOSIS — Z87891 Personal history of nicotine dependence: Secondary | ICD-10-CM | POA: Diagnosis not present

## 2022-07-30 DIAGNOSIS — K219 Gastro-esophageal reflux disease without esophagitis: Secondary | ICD-10-CM | POA: Diagnosis present

## 2022-07-30 DIAGNOSIS — J45909 Unspecified asthma, uncomplicated: Secondary | ICD-10-CM | POA: Diagnosis present

## 2022-07-30 LAB — GLUCOSE, CAPILLARY
Glucose-Capillary: 134 mg/dL — ABNORMAL HIGH (ref 70–99)
Glucose-Capillary: 148 mg/dL — ABNORMAL HIGH (ref 70–99)
Glucose-Capillary: 138 mg/dL — ABNORMAL HIGH (ref 70–99)
Glucose-Capillary: 161 mg/dL — ABNORMAL HIGH (ref 70–99)

## 2022-07-30 MED ORDER — ALUM & MAG HYDROXIDE-SIMETH 200-200-20 MG/5ML PO SUSP
30.0000 mL | ORAL | Status: DC | PRN
  Administered 2022-07-30 (×2): 30 mL via ORAL
  Filled 2022-07-30 (×2): qty 30

## 2022-07-30 MED ORDER — INSULIN ASPART 100 UNIT/ML IJ SOLN: 0.0000 [IU] | Freq: Every day | INTRAMUSCULAR | Status: DC

## 2022-07-30 MED ORDER — INSULIN ASPART 100 UNIT/ML IJ SOLN: 0.0000 [IU] | Freq: Three times a day (TID) | INTRAMUSCULAR | Status: DC

## 2022-07-30 MED FILL — Thrombin For Soln 5000 Unit: CUTANEOUS | Qty: 2 | Status: AC

## 2022-07-30 MED FILL — Thrombin For Soln 5000 Unit: CUTANEOUS | Qty: 5000 | Status: AC

## 2022-07-30 NOTE — Evaluation (Signed)
Occupational Therapy Evaluation Patient Details Name: Savannah Clark MRN: [id number] DOB: 1961/04/11 Today's Date: 07/30/2022   History of Present Illness Pt is a 62 y.o. female s/p C3-6 ACDF. PMH significant for anemia, DM II, asthma, arthritis, depression, celiac disease, GERD, chronic leukopenia, HTN, pancreatitis, PTSD, CKD stage 3, sleep apnea.   Clinical Impression   PTA, pt lived alone and was independent in ADL and IADL; did not drive. Upon eval, pt performing UB Adl with set-up and LB ADL with supervision. Pt presents with generalized LE weakness, slowed gait, decreased balance, decreased knowledge of precautions. Pt educated and demonstrating use of compensatory techniques for bed mobility, LB dressing, UB dressing, grooming, toileting, shower transfers, and stair training within precautions. Pt with increased fall risk and will required BSC for night time use next to her sleeping area to prevent falls. Recommending discharge home with friends/neighbors to assist as needed. OT to sign off; all education provided. Please re-consult if change in status.    Recommendations for follow up therapy are one component of a multi-disciplinary discharge planning process, led by the attending physician.  Recommendations may be updated based on patient status, additional functional criteria and insurance authorization.   Follow Up Recommendations  No OT follow up     Assistance Recommended at Discharge Intermittent Supervision/Assistance  Patient can return home with the following A little help with bathing/dressing/bathroom;A little help with walking and/or transfers;Assistance with cooking/housework;Assist for transportation;Help with stairs or ramp for entrance    Functional Status Assessment  Patient has had a recent decline in their functional status and demonstrates the ability to make significant improvements in function in a reasonable and predictable amount of time.  Equipment  Recommendations  BSC/3in1;Other (comment) (RW)    Recommendations for Other Services       Precautions / Restrictions Precautions Precautions: Cervical Precaution Booklet Issued: Yes (comment) Precaution Comments: Add education provided within the context of ADL Required Braces or Orthoses:  (no brace needed orders) Restrictions Weight Bearing Restrictions: No      Mobility Bed Mobility Overal bed mobility: Modified Independent             General bed mobility comments: use of log roll after initial education    Transfers Overall transfer level: Needs assistance Equipment used: None Transfers: Sit to/from Stand Sit to Stand: Supervision           General transfer comment: for safety secondary to dizziness      Balance Overall balance assessment: Mild deficits observed, not formally tested                                         ADL either performed or assessed with clinical judgement   ADL Overall ADL's : Needs assistance/impaired Eating/Feeding: Independent   Grooming: Supervision/safety;Standing   Upper Body Bathing: Set up;Sitting   Lower Body Bathing: Supervison/ safety;Sit to/from stand   Upper Body Dressing : Set up;Sitting   Lower Body Dressing: Supervision/safety;Sit to/from stand   Toilet Transfer: Supervision/safety;Regular Toilet;Rolling walker (2 wheels) Toilet Transfer Details (indicate cue type and reason): Performed simulated toilet transfer with and without RW. Supervision for both due to slowed gait and guarding. Pt with intermittent dizziness and thus, has been using RW Toileting- Water quality scientist and Hygiene: Supervision/safety;Sitting/lateral lean   Tub/ Shower Transfer: Tub transfer;Supervision/safety;Ambulation;BSC/3in1;Rolling walker (2 wheels) Tub/Shower Transfer Details (indicate cue type and reason): simulated in room Functional mobility  during ADLs: Supervision/safety;Rolling walker (2 wheels) (with  and without RW) General ADL Comments: Additionally performed strain training with supervision     Vision Baseline Vision/History: 0 No visual deficits Ability to See in Adequate Light: 0 Adequate Patient Visual Report: No change from baseline Vision Assessment?: No apparent visual deficits     Perception Perception Perception Tested?: No   Praxis Praxis Praxis tested?: Within functional limits    Pertinent Vitals/Pain Pain Assessment Pain Assessment: Faces Faces Pain Scale: Hurts even more Pain Location: neck Pain Descriptors / Indicators: Operative site guarding Pain Intervention(s): Limited activity within patient's tolerance, Monitored during session     Hand Dominance     Extremity/Trunk Assessment Upper Extremity Assessment Upper Extremity Assessment: Overall WFL for tasks assessed   Lower Extremity Assessment Lower Extremity Assessment: Generalized weakness   Cervical / Trunk Assessment Cervical / Trunk Assessment: Neck Surgery   Communication Communication Communication: No difficulties   Cognition Arousal/Alertness: Awake/alert Behavior During Therapy: WFL for tasks assessed/performed Overall Cognitive Status: Within Functional Limits for tasks assessed                                       General Comments  VSS    Exercises     Shoulder Instructions      Home Living Family/patient expects to be discharged to:: Private residence Living Arrangements: Alone Available Help at Discharge: Neighbor;Available PRN/intermittently;Friend(s) Type of Home: Other(Comment) (town home) Home Access: Stairs to enter Technical brewer of Steps: 3 Entrance Stairs-Rails: Right Home Layout: Two level;Bed/bath upstairs Alternate Level Stairs-Number of Steps: flight Alternate Level Stairs-Rails: Left Bathroom Shower/Tub: Teacher, early years/pre: Standard     Home Equipment: None   Additional Comments: Pt reports she bought a  recliner she plans to sleep in on the main level of her home for several days.      Prior Functioning/Environment Prior Level of Function : Independent/Modified Independent               ADLs Comments: Independent in ADL and IADL, does not drive        OT Problem List: Decreased strength;Decreased activity tolerance;Impaired balance (sitting and/or standing);Decreased knowledge of use of DME or AE;Decreased knowledge of precautions;Pain      OT Treatment/Interventions: Self-care/ADL training;Therapeutic exercise    OT Goals(Current goals can be found in the care plan section) Acute Rehab OT Goals Patient Stated Goal: decreased pain OT Goal Formulation: With patient Time For Goal Achievement: 08/13/22 Potential to Achieve Goals: Good  OT Frequency: Min 2X/week    Co-evaluation              AM-PAC OT "6 Clicks" Daily Activity     Outcome Measure Help from another person eating meals?: None Help from another person taking care of personal grooming?: A Little Help from another person toileting, which includes using toliet, bedpan, or urinal?: A Little Help from another person bathing (including washing, rinsing, drying)?: A Little Help from another person to put on and taking off regular upper body clothing?: A Little Help from another person to put on and taking off regular lower body clothing?: A Little 6 Click Score: 19   End of Session Equipment Utilized During Treatment: Gait belt;Rolling walker (2 wheels) Nurse Communication: Mobility status  Activity Tolerance: Patient tolerated treatment well Patient left: in bed;with call bell/phone within reach  OT Visit Diagnosis: Unsteadiness on feet (R26.81);Muscle weakness (  generalized) (M62.81);Other abnormalities of gait and mobility (R26.89);Pain Pain - part of body:  (neck)                Time: [id number] OT Time Calculation (min): 30 min Charges:  OT General Charges $OT Visit: 1 Visit OT Evaluation $OT Eval  Low Complexity: 1 Low OT Treatments $Self Care/Home Management : 8-22 mins  Elder Cyphers, OTR/L [facility] Acute Rehabilitation Office: [phone]   Magnus Ivan 07/30/2022, 8:39 AM

## 2022-07-30 NOTE — Progress Notes (Signed)
Patient ID: Savannah Clark, female   DOB: [birth year], 62 y.o.   MRN: [id number] Subjective: Patient reports less nausea, neck and throat soreness, no arm pain or NTW  Objective: Vital signs in last 24 hours: Temp:  [97.5 F (36.4 C)-98.1 F (36.7 C)] 97.9 F (36.6 C) (02/22 0711) Pulse Rate:  [57-88] 73 (02/22 0711) Resp:  [4-20] 18 (02/22 0711) BP: (123-166)/(79-97) 141/96 (02/22 0711) SpO2:  [92 %-100 %] 100 % (02/22 0711)  Intake/Output from previous day: 02/21 0701 - 02/22 0700 In: 1100 [I.V.:1100] Out: 150 [Blood:150] Intake/Output this shift: No intake/output data recorded.  Neurologic: Grossly normal, incision CDI  Lab Results: Lab Results  Component Value Date   WBC 2.6 (L) 07/22/2022   HGB 12.4 07/22/2022   HCT 36.2 07/22/2022   MCV 92.1 07/22/2022   PLT 253 07/22/2022   Lab Results  Component Value Date   INR 1.1 07/22/2022   BMET Lab Results  Component Value Date   NA 139 07/22/2022   K 3.9 07/22/2022   CL 103 07/22/2022   CO2 25 07/22/2022   GLUCOSE 86 07/22/2022   BUN 10 07/22/2022   CREATININE 0.97 07/22/2022   CALCIUM 9.2 07/22/2022    Studies/Results: DG Cervical Spine 2 or 3 views  Result Date: 07/29/2022 CLINICAL DATA:  [id number] Surgery, elective [id number] EXAM: CERVICAL SPINE - 2-3 VIEW COMPARISON:  MRI cervical spine 06/30/2022 FINDINGS: Intraoperative images during cervical spine ACDF from C3-C6. Hardware is intact. No evidence of immediate complication. IMPRESSION: Intraoperative images during C3-C6 ACDF.  Hardware is intact. Electronically Signed   By: Maurine Simmering M.D.   On: 07/29/2022 12:42   DG C-Arm 1-60 Min-No Report  Result Date: 07/29/2022 Fluoroscopy was utilized by the requesting physician.  No radiographic interpretation.   DG C-Arm 1-60 Min-No Report  Result Date: 07/29/2022 Fluoroscopy was utilized by the requesting physician.  No radiographic interpretation.   DG C-Arm 1-60 Min-No Report  Result Date: 07/29/2022 Fluoroscopy was  utilized by the requesting physician.  No radiographic interpretation.    Assessment/Plan: Better than yesterday, still requiring IV pain meds and has yet to mobilize. OT eval today. Likely home tomorrow  Estimated body mass index is 19.03 kg/m as calculated from the following:   Height as of this encounter: 5' 7"$  (1.702 m).   Weight as of this encounter: 55.1 kg.    LOS: 0 days    Eustace Moore 07/30/2022, 7:50 AM

## 2022-07-31 ENCOUNTER — Encounter (HOSPITAL_COMMUNITY): Payer: Self-pay | Admitting: Neurological Surgery

## 2022-07-31 LAB — GLUCOSE, CAPILLARY: Glucose-Capillary: 134 mg/dL — ABNORMAL HIGH (ref 70–99)

## 2022-07-31 MED ORDER — HYDROCODONE-ACETAMINOPHEN 5-325 MG PO TABS: 1.0000 | ORAL_TABLET | ORAL | 0 refills | Status: AC | PRN

## 2022-07-31 MED ORDER — CYCLOBENZAPRINE HCL 10 MG PO TABS: 10.0000 mg | ORAL_TABLET | Freq: Three times a day (TID) | ORAL | 0 refills | Status: AC | PRN

## 2022-07-31 NOTE — Discharge Summary (Signed)
Physician Discharge Summary  Patient ID: Savannah Clark MRN: [id number] DOB/AGE: 62/62/1962 62 y.o.  Admit date: 07/29/2022 Discharge date: 07/31/2022  Admission Diagnoses: cervical stenosis    Discharge Diagnoses: same   Discharged Condition: good  Hospital Course: The patient was admitted on 07/29/2022 and taken to the operating room where the patient underwent ACDF C3-4 C4-5 C5-6. The patient tolerated the procedure well and was taken to the recovery room and then to the floor in stable condition. The hospital course was routine. There were no complications. The wound remained clean dry and intact. Pt had appropriate neck soreness. No complaints of arm pain or new N/T/W. The patient remained afebrile with stable vital signs, and tolerated a regular diet. The patient continued to increase activities, and pain was well controlled with oral pain medications.   Consults: None  Significant Diagnostic Studies:  Results for orders placed or performed during the hospital encounter of 07/29/22  Glucose, capillary  Result Value Ref Range   Glucose-Capillary 88 70 - 99 mg/dL  Glucose, capillary  Result Value Ref Range   Glucose-Capillary 144 (H) 70 - 99 mg/dL  Glucose, capillary  Result Value Ref Range   Glucose-Capillary 121 (H) 70 - 99 mg/dL  Glucose, capillary  Result Value Ref Range   Glucose-Capillary 102 (H) 70 - 99 mg/dL   Comment 1 Notify RN    Comment 2 Document in Chart   Glucose, capillary  Result Value Ref Range   Glucose-Capillary 134 (H) 70 - 99 mg/dL   Comment 1 Notify RN    Comment 2 Document in Chart   Glucose, capillary  Result Value Ref Range   Glucose-Capillary 148 (H) 70 - 99 mg/dL  Glucose, capillary  Result Value Ref Range   Glucose-Capillary 138 (H) 70 - 99 mg/dL   Comment 1 Notify RN    Comment 2 Document in Chart   Glucose, capillary  Result Value Ref Range   Glucose-Capillary 161 (H) 70 - 99 mg/dL   Comment 1 Notify RN    Comment 2 Document in Chart    Glucose, capillary  Result Value Ref Range   Glucose-Capillary 134 (H) 70 - 99 mg/dL   Comment 1 Notify RN    Comment 2 Document in Chart   Type and screen [facility]  Result Value Ref Range   ABO/RH(D) A POS    Antibody Screen NEG    Sample Expiration      08/01/2022,2359 Performed at [facility], [address], [place], [place] [address]   ABO/Rh  Result Value Ref Range   ABO/RH(D)      A POS Performed at [facility] [address] [place], [place] [address]     DG Cervical Spine 2 or 3 views  Result Date: 07/29/2022 CLINICAL DATA:  [id number] Surgery, elective [id number] EXAM: CERVICAL SPINE - 2-3 VIEW COMPARISON:  MRI cervical spine 06/30/2022 FINDINGS: Intraoperative images during cervical spine ACDF from C3-C6. Hardware is intact. No evidence of immediate complication. IMPRESSION: Intraoperative images during C3-C6 ACDF.  Hardware is intact. Electronically Signed   By: Maurine Simmering M.D.   On: 07/29/2022 12:42   DG C-Arm 1-60 Min-No Report  Result Date: 07/29/2022 Fluoroscopy was utilized by the requesting physician.  No radiographic interpretation.   DG C-Arm 1-60 Min-No Report  Result Date: 07/29/2022 Fluoroscopy was utilized by the requesting physician.  No radiographic interpretation.   DG C-Arm 1-60 Min-No Report  Result Date: 07/29/2022 Fluoroscopy was utilized by  the requesting physician.  No radiographic interpretation.    Antibiotics:  Anti-infectives (From admission, onward)    Start     Dose/Rate Route Frequency Ordered Stop   07/29/22 1900  vancomycin (VANCOCIN) IVPB 1000 mg/200 mL premix        1,000 mg 200 mL/hr over 60 Minutes Intravenous  Once 07/29/22 1509 07/29/22 1901   07/29/22 0630  vancomycin (VANCOCIN) IVPB 1000 mg/200 mL premix        1,000 mg 200 mL/hr over 60 Minutes Intravenous On call to O.R. 07/29/22 [id number] 07/29/22 0831       Discharge Exam: Blood pressure (!) 165/101, pulse 76, temperature 98.3 F (36.8  C), temperature source Oral, resp. rate 20, height '5\' 7"'$  (1.702 m), weight 55.1 kg, SpO2 99 %. Neurologic: Grossly normal Incision CDI  Discharge Medications:   Allergies as of 07/31/2022       Reactions   Adhesive [tape] Hives, Other (See Comments)   Blisters   Amoxicillin-pot Clavulanate Nausea And Vomiting   Aspirin Other (See Comments)   Gi upset   Farxiga [dapagliflozin] Diarrhea, Nausea And Vomiting   shakes   Gluten Meal    Januvia [sitagliptin] Diarrhea, Nausea And Vomiting   shakes   Lactose Intolerance (gi)    Morphine Nausea Only   Pt states morphine makes "her really dizzy, nauseated, and makes her head swim."   Wheat Diarrhea, Nausea Only, Other (See Comments)   Joints pain        Medication List     TAKE these medications    albuterol 108 (90 Base) MCG/ACT inhaler Commonly known as: VENTOLIN HFA Inhale 2 puffs into the lungs every 6 (six) hours as needed for shortness of breath or wheezing.   aspirin EC 81 MG tablet Take 81 mg by mouth daily.   Azelastine HCl 137 MCG/SPRAY Soln Place 1-2 sprays into both nostrils daily as needed (allergies).   Biotin 5000 MCG Caps Take 5,000 mcg by mouth daily.   buPROPion 300 MG 24 hr tablet Commonly known as: WELLBUTRIN XL TAKE 1 TABLET (300 MG TOTAL) BY MOUTH EVERY MORNING.   CALCIUM PO Take 1 tablet by mouth daily.   cyclobenzaprine 10 MG tablet Commonly known as: FLEXERIL Take 1 tablet (10 mg total) by mouth 3 (three) times daily as needed for muscle spasms.   dicyclomine 20 MG tablet Commonly known as: BENTYL Take 1 tablet (20 mg total) by mouth 2 (two) times daily.   Dupixent 300 MG/2ML Sopn Generic drug: Dupilumab Inject 300 mg into the skin every 14 (fourteen) days.   famotidine 20 MG tablet Commonly known as: PEPCID Take 1 tablet (20 mg total) by mouth 2 (two) times daily.   FLUoxetine 10 MG capsule Commonly known as: PROZAC Take 1 capsule (10 mg total) by mouth daily.   fluticasone 50  MCG/ACT nasal spray Commonly known as: FLONASE Place 1 spray into both nostrils daily for 5 doses. What changed:  when to take this reasons to take this   Fluticasone-Salmeterol 250-50 MCG/DOSE Aepb Commonly known as: ADVAIR Inhale 1 puff into the lungs 2 (two) times daily.   HYDROcodone-acetaminophen 5-325 MG tablet Commonly known as: NORCO/VICODIN Take 1 tablet by mouth every 4 (four) hours as needed for moderate pain ((score 4 to 6)).   hydroquinone 4 % cream hydroquinone 4 % topical cream  APPLY TOPICALLY 2 TIMES DAILY. NIGHTLY TO DARK SPOTS ONLY.   hydrOXYzine 25 MG tablet Commonly known as: ATARAX TAKE 1 TABLET BY MOUTH THREE  TIMES A DAY AS NEEDED   Linzess 145 MCG Caps capsule Generic drug: linaclotide Take 145 mcg by mouth daily as needed (constipation).   Mounjaro 10 MG/0.5ML Pen Generic drug: tirzepatide Inject 1 pen (10 MG) into the skin once a week   ondansetron 4 MG disintegrating tablet Commonly known as: ZOFRAN-ODT Take 1 tablet (4 mg total) by mouth every 8 (eight) hours as needed for nausea or vomiting.   ondansetron 8 MG disintegrating tablet Commonly known as: ZOFRAN-ODT Take 8 mg by mouth every 4 (four) hours as needed for vomiting or nausea.   promethazine 12.5 MG tablet Commonly known as: PHENERGAN Take 12.5 mg by mouth every 6 (six) hours as needed for nausea or vomiting.   traZODone 50 MG tablet Commonly known as: DESYREL Take 12.5 mg by mouth at bedtime as needed for sleep.        Disposition: home   Final Dx: ACDF C3-4 C4-5 C5-6  Discharge Instructions     Call MD for:  difficulty breathing, headache or visual disturbances   Complete by: As directed    Call MD for:  hives   Complete by: As directed    Call MD for:  persistant dizziness or light-headedness   Complete by: As directed    Call MD for:  persistant nausea and vomiting   Complete by: As directed    Call MD for:  redness, tenderness, or signs of infection (pain,  swelling, redness, odor or green/yellow discharge around incision site)   Complete by: As directed    Call MD for:  severe uncontrolled pain   Complete by: As directed    Call MD for:  temperature >100.4   Complete by: As directed    Diet - low sodium heart healthy   Complete by: As directed    Driving Restrictions   Complete by: As directed    No driving for 2 weeks, no riding in the car for 1 week   Increase activity slowly   Complete by: As directed    Lifting restrictions   Complete by: As directed    No lifting more than 8 lbs   Remove dressing in 48 hours   Complete by: As directed           Signed: Eustace Moore 07/31/2022, 7:31 AM

## 2022-07-31 NOTE — Plan of Care (Signed)
Pt doing well. Pt given D/C instructions with verbal understanding. Rx's were sent to the pharmacy by MD. Pt's incision is clean and dry with no sign of infection. Pt's IV was removed prior to D/C. Pt D/C'd home via wheelchair per MD order. Pt is stable @ D/C and has no other needs at this time. Lilygrace Rodick, RN  

## 2022-08-04 ENCOUNTER — Encounter (HOSPITAL_COMMUNITY): Payer: Self-pay | Admitting: Neurological Surgery

## 2022-08-04 ENCOUNTER — Other Ambulatory Visit (HOSPITAL_COMMUNITY): Payer: Self-pay

## 2022-08-04 MED ORDER — MOUNJARO 10 MG/0.5ML ~~LOC~~ SOAJ
10.0000 mg | SUBCUTANEOUS | 1 refills | Status: AC
  Filled 2022-08-04: qty 6, 84d supply, fill #0
  Filled 2022-08-18: qty 2, 28d supply, fill #0
  Filled 2022-09-09: qty 2, 28d supply, fill #1
  Filled 2022-10-10: qty 2, 28d supply, fill #2
  Filled 2022-11-08 – 2022-12-03 (×2): qty 2, 28d supply, fill #3
  Filled 2023-07-11: qty 2, 28d supply, fill #4

## 2022-08-05 ENCOUNTER — Other Ambulatory Visit (HOSPITAL_COMMUNITY): Payer: Self-pay

## 2022-08-05 MED ORDER — MOUNJARO 7.5 MG/0.5ML ~~LOC~~ SOAJ
7.5000 mg | SUBCUTANEOUS | 0 refills | Status: AC
  Filled 2022-08-05 – 2022-11-12 (×2): qty 2, 28d supply, fill #0
  Filled 2023-06-14: qty 2, 28d supply, fill #1

## 2022-08-10 ENCOUNTER — Other Ambulatory Visit (HOSPITAL_COMMUNITY): Payer: Self-pay

## 2022-08-18 ENCOUNTER — Other Ambulatory Visit (HOSPITAL_COMMUNITY): Payer: Self-pay

## 2022-08-31 ENCOUNTER — Other Ambulatory Visit (HOSPITAL_COMMUNITY): Payer: Self-pay

## 2022-08-31 MED ORDER — MOUNJARO 7.5 MG/0.5ML ~~LOC~~ SOAJ
7.5000 mg | SUBCUTANEOUS | 0 refills | Status: AC
  Filled 2022-08-31: qty 2, 28d supply, fill #0

## 2022-09-08 ENCOUNTER — Other Ambulatory Visit (HOSPITAL_COMMUNITY): Payer: Self-pay

## 2022-09-09 ENCOUNTER — Other Ambulatory Visit (HOSPITAL_COMMUNITY): Payer: Self-pay

## 2022-09-20 ENCOUNTER — Other Ambulatory Visit (HOSPITAL_COMMUNITY): Payer: Self-pay

## 2022-09-20 MED ORDER — ONDANSETRON 8 MG PO TBDP
8.0000 mg | ORAL_TABLET | ORAL | 3 refills | Status: AC | PRN
  Filled 2022-09-20 – 2022-10-10 (×2): qty 90, 15d supply, fill #0

## 2022-09-21 ENCOUNTER — Other Ambulatory Visit (HOSPITAL_COMMUNITY): Payer: Self-pay

## 2022-09-28 ENCOUNTER — Other Ambulatory Visit (HOSPITAL_COMMUNITY): Payer: Self-pay

## 2022-10-07 ENCOUNTER — Ambulatory Visit: Payer: Medicaid Other | Admitting: Obstetrics and Gynecology

## 2022-10-10 ENCOUNTER — Other Ambulatory Visit (HOSPITAL_COMMUNITY): Payer: Self-pay

## 2022-11-05 ENCOUNTER — Other Ambulatory Visit (HOSPITAL_COMMUNITY): Payer: Self-pay

## 2022-11-11 ENCOUNTER — Other Ambulatory Visit (HOSPITAL_COMMUNITY): Payer: Self-pay

## 2022-11-12 ENCOUNTER — Other Ambulatory Visit (HOSPITAL_COMMUNITY): Payer: Self-pay

## 2022-11-14 ENCOUNTER — Other Ambulatory Visit (HOSPITAL_COMMUNITY): Payer: Self-pay

## 2022-11-16 ENCOUNTER — Other Ambulatory Visit (HOSPITAL_COMMUNITY): Payer: Self-pay

## 2022-11-25 ENCOUNTER — Ambulatory Visit: Payer: Medicaid Other | Admitting: Obstetrics and Gynecology

## 2022-12-03 ENCOUNTER — Other Ambulatory Visit (HOSPITAL_COMMUNITY): Payer: Self-pay

## 2022-12-07 ENCOUNTER — Other Ambulatory Visit: Payer: Self-pay

## 2022-12-14 ENCOUNTER — Other Ambulatory Visit (HOSPITAL_COMMUNITY): Payer: Self-pay

## 2023-01-25 ENCOUNTER — Ambulatory Visit: Payer: Medicaid Other | Admitting: Obstetrics and Gynecology

## 2023-02-01 ENCOUNTER — Encounter: Payer: Self-pay | Admitting: Obstetrics and Gynecology

## 2023-02-01 ENCOUNTER — Ambulatory Visit: Payer: Medicaid Other | Admitting: Obstetrics and Gynecology

## 2023-02-01 VITALS — BP 130/85 | HR 77 | Ht 67.0 in | Wt 115.0 lb

## 2023-02-01 DIAGNOSIS — Z01419 Encounter for gynecological examination (general) (routine) without abnormal findings: Secondary | ICD-10-CM | POA: Diagnosis not present

## 2023-02-01 DIAGNOSIS — Z1339 Encounter for screening examination for other mental health and behavioral disorders: Secondary | ICD-10-CM

## 2023-02-01 MED ORDER — ESTRADIOL 0.025 MG/24HR TD PTWK: 0.0250 mg | MEDICATED_PATCH | TRANSDERMAL | 12 refills | Status: DC

## 2023-02-01 MED ORDER — MEDROXYPROGESTERONE ACETATE 5 MG PO TABS: 5.0000 mg | ORAL_TABLET | Freq: Every day | ORAL | 12 refills | Status: DC

## 2023-02-01 NOTE — Progress Notes (Signed)
62 y.o. New GYN presents for AEX/Hormone replacement therapy.  C/o night sweats, hot flashes, brain fog.   Last Mammogram was approx. 4 years ago, Pt is waiting for Breast surgery and will get a Mammogram afterwards.

## 2023-02-01 NOTE — Progress Notes (Signed)
Subjective:     Savannah Clark is a 62 y.o. female P0 postmenopausal with BMI 18 who is here for a comprehensive physical exam. The patient reports vasomotor symptoms which worsened over time. She reports hot flashes and night sweats which keep her up at night. She also repots severe vaginal dryness. She denies any episodes of postmenopausal vaginal bleeding. She is not sexually active. She denies pelvic pain or abnormal discharge. She denies urinary incontinence or issues with bowel movements. She reports leakage of silicone from breast implants and is in the process of having implants removed.  Past Medical History:  Diagnosis Date   Anemia    Anxiety    Arthritis    RA and OA   Asthma    Bronchitis    Celiac disease    Chronic leukopenia    Depression    Diabetes mellitus without complication (HCC)    Type 2   GERD (gastroesophageal reflux disease)    Hypertension    Hx with increased weight   Night terror disorder    Pancreatitis, chronic (HCC)    PTSD (post-traumatic stress disorder)    Renal disorder    CKD 3   Sleep apnea    Past Surgical History:  Procedure Laterality Date   ANTERIOR CERVICAL DECOMP/DISCECTOMY FUSION N/A 07/29/2022   Procedure: CERVICAL THREE-FOUR, CERVICAL FOUR-FIVE, CERVICAL FIVE-SIX ANTERIOR CERVICAL DECOMPRESSION/DISCECTOMY FUSION;  Surgeon: Tia Alert, MD;  Location: [facility] OR;  Service: Neurosurgery;  Laterality: N/A;   BREAST SURGERY     Breast Augmentation   COLONOSCOPY  2022   ESOPHAGOGASTRODUODENOSCOPY ENDOSCOPY  2022   FOOT SURGERY Bilateral    Hammer Toe   RHINOPLASTY     TONSILLECTOMY  1970   No family history on file.   Social History   Socioeconomic History   Marital status: Single    Spouse name: Not on file   Number of children: Not on file   Years of education: Not on file   Highest education level: Not on file  Occupational History   Not on file  Tobacco Use   Smoking status: Former    Current packs/day: 0.00    Types:  Cigarettes    Quit date: 06/09/2007    Years since quitting: 15.6   Smokeless tobacco: Never  Substance and Sexual Activity   Alcohol use: Yes    Comment: rare   Drug use: No   Sexual activity: Not Currently    Birth control/protection: Post-menopausal  Other Topics Concern   Not on file  Social History Narrative   Not on file   Social Determinants of Health   Financial Resource Strain: Not on file  Food Insecurity: Not on file  Transportation Needs: Not on file  Physical Activity: Not on file  Stress: Not on file  Social Connections: Not on file  Intimate Partner Violence: Not on file   Health Maintenance  Topic Date Due   HIV Screening  Never done   Diabetic kidney evaluation - Urine ACR  Never done   Hepatitis C Screening  Never done   PAP SMEAR-Modifier  Never done   Colonoscopy  Never done   MAMMOGRAM  Never done   Zoster Vaccines- Shingrix (1 of 2) Never done   COVID-19 Vaccine (3 - 2023-24 season) 02/06/2022   INFLUENZA VACCINE  01/07/2023   Diabetic kidney evaluation - eGFR measurement  07/23/2023   DTaP/Tdap/Td (2 - Td or Tdap) 04/06/2029   HPV VACCINES  Aged Out  Review of Systems Pertinent items noted in HPI and remainder of comprehensive ROS otherwise negative.   Objective:  Blood pressure 130/85, pulse 77, height 5\' 7"  (1.702 m), weight 115 lb (52.2 kg).   GENERAL: Well-developed, well-nourished female in no acute distress.  HEENT: Normocephalic, atraumatic. Sclerae anicteric.  NECK: Supple. Normal thyroid.  LUNGS: Clear to auscultation bilaterally.  HEART: Regular rate and rhythm. BREASTS: Symmetric in size. Very hard and tender due to silicone leak. No palpable masses or lymphadenopathy, skin changes, or nipple drainage. Breast exam limited ABDOMEN: Soft, nontender, nondistended. No organomegaly. PELVIC: Normal external female genitalia. Vagina is pale and atrophic.  Normal discharge. Normal appearing cervix. Uterus is normal in size. No  adnexal mass or tenderness. Chaperone present during the pelvic exam EXTREMITIES: No cyanosis, clubbing, or edema, 2+ distal pulses.     Assessment:    Healthy female exam.      Plan:    Pap smear collected Screening mammogram deferred until post implant removal Patient current with colonoscopy Patient will be contacted with abnormal results Long discussion on HRT and alternative treatments. Patient is interested in HRT. Rx climara and provera provided. Advised patient that the goal of therapy is to remain on the lowest dose possible for the least amount of time possible with the objective being an improvement in menopausal symptoms rather than complete resolution of symptoms. Patient verbalized understanding See After Visit Summary for Counseling Recommendations

## 2023-02-11 LAB — CYTOLOGY - PAP
Adequacy: ABNORMAL
Comment: NEGATIVE
High risk HPV: NEGATIVE

## 2023-03-26 ENCOUNTER — Telehealth: Payer: Self-pay

## 2023-03-26 NOTE — Telephone Encounter (Signed)
Patient called to follow up on the use of provera and estradiol patch. Patient states that she was told to follow up if sx has not improved within 6 weeks.   States that she medication was effective at the start of treatment, but is no longer effective.   Requests for dosage to be increased for symptom relief.  Please advise

## 2023-03-29 ENCOUNTER — Ambulatory Visit: Payer: Medicaid Other | Admitting: Obstetrics and Gynecology

## 2023-03-29 ENCOUNTER — Other Ambulatory Visit: Payer: Self-pay | Admitting: Obstetrics and Gynecology

## 2023-03-29 MED ORDER — ESTRADIOL 0.0375 MG/24HR TD PTWK: 0.0375 mg | MEDICATED_PATCH | TRANSDERMAL | 12 refills | Status: DC

## 2023-03-29 NOTE — Telephone Encounter (Signed)
Patient informed. 

## 2023-04-12 ENCOUNTER — Encounter: Payer: Self-pay | Admitting: Obstetrics and Gynecology

## 2023-04-12 ENCOUNTER — Other Ambulatory Visit (HOSPITAL_COMMUNITY)
Admission: RE | Admit: 2023-04-12 | Discharge: 2023-04-12 | Disposition: A | Discharge status: Home / Self Care | Payer: Medicaid Other | Source: Ambulatory Visit | Attending: Obstetrics and Gynecology | Admitting: Obstetrics and Gynecology

## 2023-04-12 ENCOUNTER — Ambulatory Visit: Payer: Medicaid Other | Admitting: Obstetrics and Gynecology

## 2023-04-12 VITALS — BP 105/75 | HR 102 | Ht 67.0 in | Wt 118.4 lb

## 2023-04-12 DIAGNOSIS — R87615 Unsatisfactory cytologic smear of cervix: Secondary | ICD-10-CM | POA: Diagnosis present

## 2023-04-12 DIAGNOSIS — Z124 Encounter for screening for malignant neoplasm of cervix: Secondary | ICD-10-CM | POA: Diagnosis not present

## 2023-04-12 NOTE — Progress Notes (Signed)
62 yo with previous non-diagnositic pap smear returning for cervical cancer screening  Past Medical History:  Diagnosis Date   Anemia    Anxiety    Arthritis    RA and OA   Asthma    Bronchitis    Celiac disease    Chronic leukopenia    Depression    Diabetes mellitus without complication (HCC)    Type 2   GERD (gastroesophageal reflux disease)    Hypertension    Hx with increased weight   Night terror disorder    Pancreatitis, chronic (HCC)    PTSD (post-traumatic stress disorder)    Renal disorder    CKD 3   Sleep apnea    Past Surgical History:  Procedure Laterality Date   ANTERIOR CERVICAL DECOMP/DISCECTOMY FUSION N/A 07/29/2022   Procedure: CERVICAL THREE-FOUR, CERVICAL FOUR-FIVE, CERVICAL FIVE-SIX ANTERIOR CERVICAL DECOMPRESSION/DISCECTOMY FUSION;  Surgeon: Tia Alert, MD;  [facility];  Service: Neurosurgery;  Laterality: N/A;   BREAST SURGERY     Breast Augmentation   COLONOSCOPY  2022   ESOPHAGOGASTRODUODENOSCOPY ENDOSCOPY  2022   FOOT SURGERY Bilateral    Hammer Toe   RHINOPLASTY     TONSILLECTOMY  1970   No family history on file. Social History   Tobacco Use   Smoking status: Former    Current packs/day: 0.00    Types: Cigarettes    Quit date: 06/09/2007    Years since quitting: 15.8   Smokeless tobacco: Never  Substance Use Topics   Alcohol use: Yes    Comment: rare   Drug use: No   ROS  See pertinent in HPI. All other systems reviewed and non contributory Blood pressure 105/75, pulse (!) 102, height 5\' 7"  (1.702 m), weight 118 lb 6.4 oz (53.7 kg).  GENERAL: Well-developed, well-nourished female in no acute distress.  ABDOMEN: Soft, nontender, nondistended. No organomegaly. PELVIC: Normal external female genitalia. Vagina is pale and atrophic.  Normal discharge. Normal appearing cervix.  Chaperone present during the pelvic exam EXTREMITIES: No cyanosis, clubbing, or edema, 2+ distal pulses.  A/P 62 yo here for repeat pap smear - Pap  smear collected - Patient will be contacted with abnormal results

## 2023-04-15 LAB — CYTOLOGY - PAP
Comment: NEGATIVE
Diagnosis: NEGATIVE
High risk HPV: NEGATIVE

## 2023-06-03 ENCOUNTER — Other Ambulatory Visit: Payer: Self-pay | Admitting: Obstetrics and Gynecology

## 2023-06-03 ENCOUNTER — Encounter: Payer: Self-pay | Admitting: Obstetrics and Gynecology

## 2023-06-10 ENCOUNTER — Other Ambulatory Visit: Payer: Self-pay | Admitting: Obstetrics and Gynecology

## 2023-06-10 MED ORDER — ESTRADIOL 0.05 MG/24HR TD PTWK: 0.0500 mg | MEDICATED_PATCH | TRANSDERMAL | 11 refills | Status: AC

## 2023-06-14 ENCOUNTER — Other Ambulatory Visit: Payer: Self-pay

## 2023-07-01 ENCOUNTER — Other Ambulatory Visit: Payer: Self-pay | Admitting: Obstetrics and Gynecology

## 2023-07-12 ENCOUNTER — Other Ambulatory Visit: Payer: Self-pay

## 2023-10-26 ENCOUNTER — Other Ambulatory Visit: Payer: Self-pay

## 2023-10-26 ENCOUNTER — Emergency Department (HOSPITAL_COMMUNITY)
Admission: EM | Admit: 2023-10-26 | Discharge: 2023-10-27 | Discharge status: Against Medical Advice | Attending: Emergency Medicine | Admitting: Emergency Medicine

## 2023-10-26 DIAGNOSIS — R519 Headache, unspecified: Secondary | ICD-10-CM | POA: Diagnosis not present

## 2023-10-26 DIAGNOSIS — Z5321 Procedure and treatment not carried out due to patient leaving prior to being seen by health care provider: Secondary | ICD-10-CM | POA: Diagnosis not present

## 2023-10-26 DIAGNOSIS — M549 Dorsalgia, unspecified: Secondary | ICD-10-CM | POA: Diagnosis not present

## 2023-10-26 DIAGNOSIS — R531 Weakness: Secondary | ICD-10-CM | POA: Insufficient documentation

## 2023-10-26 DIAGNOSIS — R110 Nausea: Secondary | ICD-10-CM | POA: Diagnosis not present

## 2023-10-26 DIAGNOSIS — M7918 Myalgia, other site: Secondary | ICD-10-CM | POA: Diagnosis not present

## 2023-10-26 LAB — URINALYSIS, ROUTINE W REFLEX MICROSCOPIC
Bilirubin Urine: NEGATIVE
Glucose, UA: NEGATIVE mg/dL
Hgb urine dipstick: NEGATIVE
Ketones, ur: NEGATIVE mg/dL
Leukocytes,Ua: NEGATIVE
Nitrite: NEGATIVE
Protein, ur: NEGATIVE mg/dL
Specific Gravity, Urine: 1.005 (ref 1.005–1.030)
pH: 8 (ref 5.0–8.0)

## 2023-10-26 LAB — COMPREHENSIVE METABOLIC PANEL WITH GFR
ALT: 23 U/L (ref 0–44)
AST: 30 U/L (ref 15–41)
Albumin: 3.6 g/dL (ref 3.5–5.0)
Alkaline Phosphatase: 47 U/L (ref 38–126)
Anion gap: 10 (ref 5–15)
BUN: 8 mg/dL (ref 8–23)
CO2: 27 mmol/L (ref 22–32)
Calcium: 9.4 mg/dL (ref 8.9–10.3)
Chloride: 99 mmol/L (ref 98–111)
Creatinine, Ser: 0.96 mg/dL (ref 0.44–1.00)
GFR, Estimated: >60 mL/min (ref >60)
Glucose, Bld: 77 mg/dL (ref 70–99)
Potassium: 4.4 mmol/L (ref 3.5–5.1)
Sodium: 136 mmol/L (ref 135–145)
Total Bilirubin: 0.6 mg/dL (ref 0.0–1.2)
Total Protein: 7 g/dL (ref 6.5–8.1)

## 2023-10-26 LAB — CBC WITH DIFFERENTIAL/PLATELET
Abs Immature Granulocytes: 0.01 10*3/uL (ref 0.00–0.07)
Basophils Absolute: 0 10*3/uL (ref 0.0–0.1)
Basophils Relative: 1 %
Eosinophils Absolute: 0.1 10*3/uL (ref 0.0–0.5)
Eosinophils Relative: 2 %
HCT: 37.9 % (ref 36.0–46.0)
Hemoglobin: 12.5 g/dL (ref 12.0–15.0)
Immature Granulocytes: 0 %
Lymphocytes Relative: 30 %
Lymphs Abs: 1.1 10*3/uL (ref 0.7–4.0)
MCH: 31.3 pg (ref 26.0–34.0)
MCHC: 33 g/dL (ref 30.0–36.0)
MCV: 94.8 fL (ref 80.0–100.0)
Monocytes Absolute: 0.5 10*3/uL (ref 0.1–1.0)
Monocytes Relative: 12 %
Neutro Abs: 2 10*3/uL (ref 1.7–7.7)
Neutrophils Relative %: 55 %
Platelets: 228 10*3/uL (ref 150–400)
RBC: 4 MIL/uL (ref 3.87–5.11)
RDW: 12.7 % (ref 11.5–15.5)
WBC: 3.7 10*3/uL — ABNORMAL LOW (ref 4.0–10.5)
nRBC: 0 % (ref 0.0–0.2)

## 2023-10-26 LAB — RESP PANEL BY RT-PCR (RSV, FLU A&B, COVID)  RVPGX2
Influenza A by PCR: NEGATIVE
Influenza B by PCR: NEGATIVE
Resp Syncytial Virus by PCR: NEGATIVE
SARS Coronavirus 2 by RT PCR: NEGATIVE

## 2023-10-26 MED ORDER — ONDANSETRON 4 MG PO TBDP
4.0000 mg | ORAL_TABLET | Freq: Once | ORAL | Status: AC
  Administered 2023-10-26: 4 mg via ORAL
  Filled 2023-10-26: qty 1

## 2023-10-26 NOTE — ED Notes (Signed)
 Pt decided to leave while waiting for a room.

## 2023-10-26 NOTE — ED Provider Triage Note (Signed)
 Emergency Medicine Provider Triage Evaluation Note  Savannah Clark , a 63 y.o. female  was evaluated in triage.  Pt complains of HA for 3 days, mild associated with N. Reports she primarily came in for severe back pain thoracic and lumbar. No midline tenderness, step of..  No red flags symptoms associated with her back pain.  Has been taking oxycodone  without relief. Has right sided radicular symptoms.   Review of Systems  Positive: Back pain Negative: Abd pain  Physical Exam  BP 138/86   Pulse 78   Temp 98.4 F (36.9 C)   Resp 18   SpO2 99%  Gen:   Awake, no distress   Resp:  Normal effort  MSK:   Moves extremities without difficulty  Other:    Medical Decision Making  Medically screening exam initiated at 5:20 PM.  Appropriate orders placed.  Savannah Clark was informed that the remainder of the evaluation will be completed by another provider, this initial triage assessment does not replace that evaluation, and the importance of remaining in the ED until their evaluation is complete.     Savannah Heron, PA-C 10/26/23 1723

## 2023-10-26 NOTE — ED Triage Notes (Signed)
 Patient arrives for eval of generalized weakness, generalized body aches, headache, and nausea without vomiting x 3 days. Does have prescription for Zofran  but did not take any PTA. No OTC meds x 2 days for headache, though she is on oxycodone  for back pain and states she woke up in worse pain than when she took it before going to bed last night. Denies fevers or sick contacts.

## 2023-12-27 ENCOUNTER — Other Ambulatory Visit: Payer: Self-pay | Admitting: Medical Genetics

## 2024-02-17 ENCOUNTER — Encounter: Payer: Self-pay | Admitting: Obstetrics and Gynecology

## 2024-02-17 ENCOUNTER — Other Ambulatory Visit: Payer: Self-pay

## 2024-02-17 MED ORDER — MEDROXYPROGESTERONE ACETATE 5 MG PO TABS: 5.0000 mg | ORAL_TABLET | Freq: Every day | ORAL | 0 refills | Status: DC

## 2024-03-10 ENCOUNTER — Ambulatory Visit (INDEPENDENT_AMBULATORY_CARE_PROVIDER_SITE_OTHER): Admitting: Obstetrics and Gynecology

## 2024-03-10 ENCOUNTER — Encounter: Payer: Self-pay | Admitting: Obstetrics and Gynecology

## 2024-03-10 VITALS — BP 134/83 | HR 77 | Ht 67.0 in | Wt 121.0 lb

## 2024-03-10 DIAGNOSIS — Z01419 Encounter for gynecological examination (general) (routine) without abnormal findings: Secondary | ICD-10-CM

## 2024-03-10 MED ORDER — MEDROXYPROGESTERONE ACETATE 5 MG PO TABS: 5.0000 mg | ORAL_TABLET | Freq: Every day | ORAL | 0 refills | Status: DC

## 2024-03-10 MED ORDER — ESTRADIOL 0.06 MG/24HR TD PTWK: 1.0000 | MEDICATED_PATCH | TRANSDERMAL | 12 refills | Status: AC

## 2024-03-10 NOTE — Progress Notes (Signed)
 Subjective:     Savannah Clark is a 63 y.o. female postmenopausal with BMI 18 who is here for a comprehensive physical exam. The patient reports no problems. She denies any episodes of postmenopausal vaginal bleeding. She denies any urinary incontinence or issues with bowel movement. Patient was able to have silicone implants removed and feels much better movement in her chest. Patient reports significant improvement in her vasomotor symptoms. She denies any hot flashes and reports occasional night sweats. Patient is without any other complaints. She lost her mother 3 years ago and was unable to attend the funeral in [place]. Patient has supportive system to help her through her grief  Past Medical History:  Diagnosis Date   Anemia    Anxiety    Arthritis    RA and OA   Asthma    Bronchitis    Celiac disease    Chronic leukopenia    Depression    Diabetes mellitus without complication (HCC)    Type 2   GERD (gastroesophageal reflux disease)    Hypertension    Hx with increased weight   Night terror disorder    Pancreatitis, chronic (HCC)    PTSD (post-traumatic stress disorder)    Renal disorder    CKD 3   Sleep apnea    Past Surgical History:  Procedure Laterality Date   ANTERIOR CERVICAL DECOMP/DISCECTOMY FUSION N/A 07/29/2022   Procedure: CERVICAL THREE-FOUR, CERVICAL FOUR-FIVE, CERVICAL FIVE-SIX ANTERIOR CERVICAL DECOMPRESSION/DISCECTOMY FUSION;  Surgeon: Joshua Alm RAMAN, MD;  Location: [facility] OR;  Service: Neurosurgery;  Laterality: N/A;   BREAST SURGERY     Breast Augmentation   COLONOSCOPY  2022   ESOPHAGOGASTRODUODENOSCOPY ENDOSCOPY  2022   FOOT SURGERY Bilateral    Hammer Toe   RHINOPLASTY     TONSILLECTOMY  1970   History reviewed. No pertinent family history.  Social History   Socioeconomic History   Marital status: Single    Spouse name: Not on file   Number of children: Not on file   Years of education: Not on file   Highest education level: Not on file   Occupational History   Not on file  Tobacco Use   Smoking status: Former    Current packs/day: 0.00    Types: Cigarettes    Quit date: 06/09/2007    Years since quitting: 16.7   Smokeless tobacco: Never  Substance and Sexual Activity   Alcohol use: Yes    Comment: rare   Drug use: No   Sexual activity: Not Currently    Birth control/protection: Post-menopausal  Other Topics Concern   Not on file  Social History Narrative   Not on file   Social Drivers of Health   Financial Resource Strain: Not on file  Food Insecurity: Not on file  Transportation Needs: Not on file  Physical Activity: Not on file  Stress: Not on file  Social Connections: Not on file  Intimate Partner Violence: Not on file   Health Maintenance  Topic Date Due   HIV Screening  Never done   Diabetic kidney evaluation - Urine ACR  Never done   Hepatitis C Screening  Never done   Pneumococcal Vaccine: 50+ Years (1 of 2 - PCV) Never done   Mammogram  Never done   Zoster Vaccines- Shingrix (1 of 2) Never done   Influenza Vaccine  Never done   COVID-19 Vaccine (3 - 2025-26 season) 02/07/2024   Diabetic kidney evaluation - eGFR measurement  10/25/2024   Cervical Cancer Screening (  HPV/Pap Cotest)  04/11/2028   DTaP/Tdap/Td (2 - Td or Tdap) 04/06/2029   Colonoscopy  01/11/2031   Hepatitis B Vaccines 19-59 Average Risk  Aged Out   HPV VACCINES  Aged Out   Meningococcal B Vaccine  Aged Out       Review of Systems Pertinent items noted in HPI and remainder of comprehensive ROS otherwise negative.   Objective:  Blood pressure 134/83, pulse 77, height 5' 7 (1.702 m), weight 121 lb (54.9 kg).   GENERAL: Well-developed, well-nourished female in no acute distress.  NEURO: alert and oriented x 3 Patient declined physical exam     Assessment:    Healthy female exam.      Plan:    Patient with normal pap smear 04/12/2023 Patient had recent colonoscopy Screening mammogram ordered Refill on [place]  and  provera  provided. Patient reminded that the goal of HRT is to improve vasomotor symptoms with the lowest dose for the shortest amount of time See After Visit Summary for Counseling Recommendations

## 2024-03-10 NOTE — Progress Notes (Signed)
 63 y.o. GYN presents for AEX. Last PAP 04/12/23 NILM

## 2024-03-11 ENCOUNTER — Other Ambulatory Visit: Payer: Self-pay | Admitting: Obstetrics and Gynecology

## 2024-04-04 ENCOUNTER — Ambulatory Visit
Admission: RE | Admit: 2024-04-04 | Discharge: 2024-04-04 | Disposition: A | Discharge status: Home / Self Care | Source: Ambulatory Visit | Attending: Obstetrics and Gynecology | Admitting: Obstetrics and Gynecology

## 2024-04-04 ENCOUNTER — Other Ambulatory Visit

## 2024-04-04 DIAGNOSIS — Z01419 Encounter for gynecological examination (general) (routine) without abnormal findings: Secondary | ICD-10-CM

## 2024-04-04 DIAGNOSIS — Z006 Encounter for examination for normal comparison and control in clinical research program: Secondary | ICD-10-CM

## 2024-04-14 LAB — GENECONNECT MOLECULAR SCREEN: Genetic Analysis Overall Interpretation: NEGATIVE

## 2024-05-19 ENCOUNTER — Other Ambulatory Visit: Payer: Self-pay | Admitting: Obstetrics and Gynecology

## 2024-06-05 NOTE — Telephone Encounter (Signed)
 Last seen 9/11 for AE Pt called office to request refill of medroxyPROGESTERone  (PROVERA ) 5 MG tablet, initially given on 9/11 for #30 w no refills. Refill was approved on 10/3 for #30 no refills.  Pt wanting to know why she has to keep requesting refills instead of them being sent to pharmacy. Please advise if refill approved

## 2024-07-13 ENCOUNTER — Encounter (HOSPITAL_BASED_OUTPATIENT_CLINIC_OR_DEPARTMENT_OTHER): Payer: Self-pay | Admitting: Pulmonary Disease

## 2024-07-13 DIAGNOSIS — R0683 Snoring: Secondary | ICD-10-CM

## 2024-07-13 DIAGNOSIS — R5383 Other fatigue: Secondary | ICD-10-CM

## 2024-07-13 DIAGNOSIS — G4710 Hypersomnia, unspecified: Secondary | ICD-10-CM
